# Patient Record
Sex: Female | Born: 1985 | Race: White | Hispanic: No | Marital: Married | Smoking: Never smoker
Health system: Southern US, Community
[De-identification: ages and names within clinical notes are randomized; demographics above are authoritative.]

## PROBLEM LIST (undated history)

## (undated) DIAGNOSIS — Z8619 Personal history of other infectious and parasitic diseases: Secondary | ICD-10-CM

## (undated) DIAGNOSIS — Z8742 Personal history of other diseases of the female genital tract: Secondary | ICD-10-CM

## (undated) DIAGNOSIS — Z9889 Other specified postprocedural states: Secondary | ICD-10-CM

## (undated) DIAGNOSIS — R7611 Nonspecific reaction to tuberculin skin test without active tuberculosis: Secondary | ICD-10-CM

## (undated) DIAGNOSIS — Z87442 Personal history of urinary calculi: Secondary | ICD-10-CM

## (undated) DIAGNOSIS — R112 Nausea with vomiting, unspecified: Secondary | ICD-10-CM

## (undated) HISTORY — DX: Personal history of other infectious and parasitic diseases: Z86.19

## (undated) HISTORY — DX: Nonspecific reaction to tuberculin skin test without active tuberculosis: R76.11

## (undated) HISTORY — DX: Personal history of urinary calculi: Z87.442

## (undated) HISTORY — DX: Personal history of other diseases of the female genital tract: Z87.42

---
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
## 2005-01-16 ENCOUNTER — Emergency Department (HOSPITAL_COMMUNITY): Admission: EM | Admit: 2005-01-16 | Discharge: 2005-01-16 | Payer: Self-pay | Admitting: Emergency Medicine

## 2005-03-09 HISTORY — PX: OTHER SURGICAL HISTORY: SHX169

## 2007-05-21 IMAGING — CT CT PELVIS W/O CM
2 of 4 series · 17 of 46 positions shown, 19 images · IV contrast (agent unspecified)
Comparison: none

CLINICAL DATA: ER patient with left flank pain.
ABDOMEN CT WITHOUT CONTRAST:
TECHNIQUE: Multidetector CT imaging of the abdomen was performed following the standard protocol without IV contrast.
TECHNIQUE: Multidetector CT imaging of the pelvis was performed following the standard protocol without IV contrast.

[Series 3: stone_wo 2.0 b40f st · axial · 0.60mm/px · z∈[+728,+1123]mm · 14 of 613 slices shown, 16 images]
[im 24/613  soft-tissue]
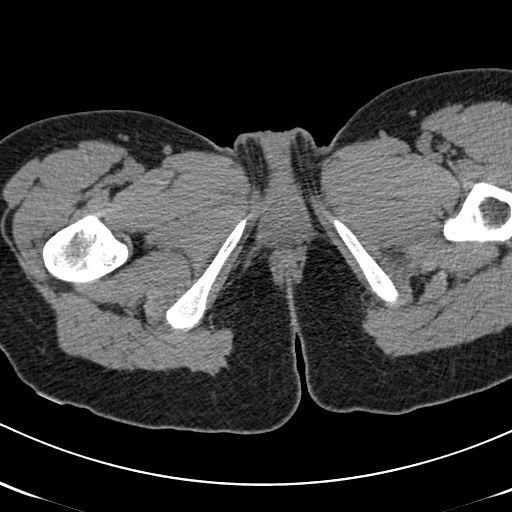
[im 24/613  bone]
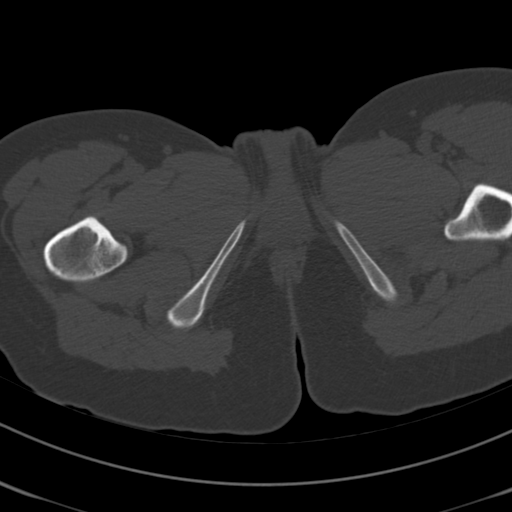
[im 71/613  soft-tissue]
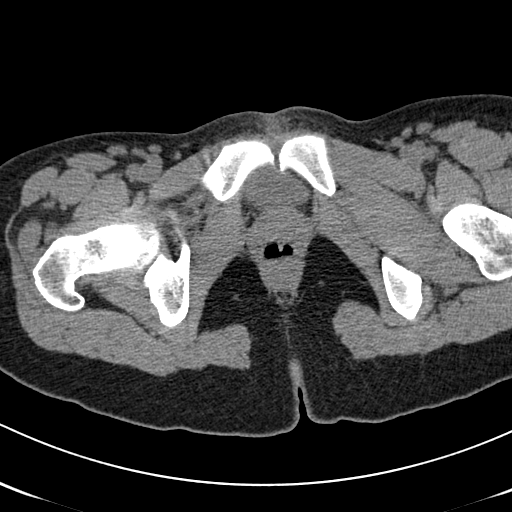
[im 118/613  soft-tissue]
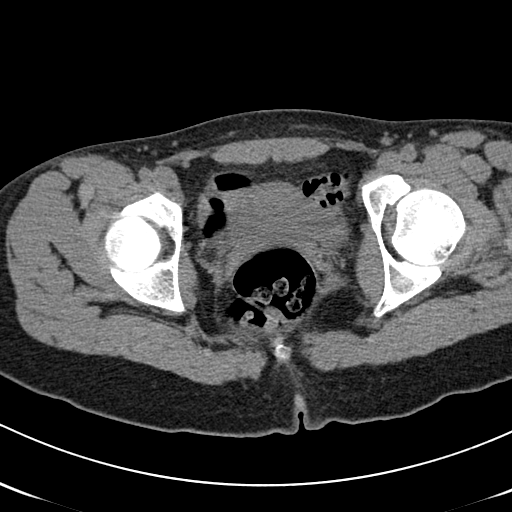
[im 165/613  soft-tissue]
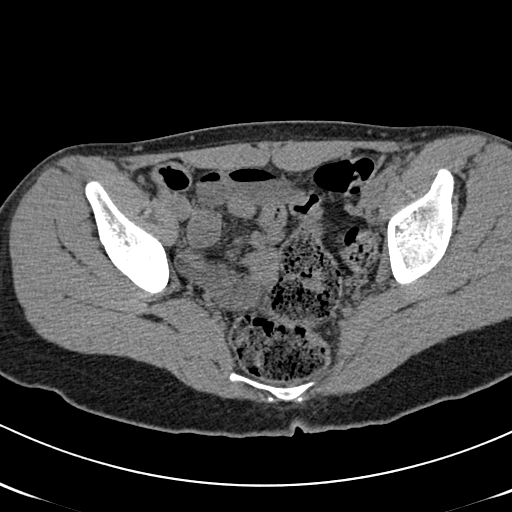
[im 212/613  soft-tissue]
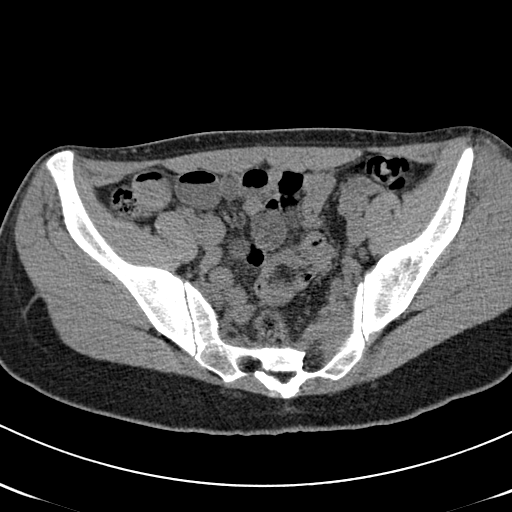
[im 236/613  soft-tissue]
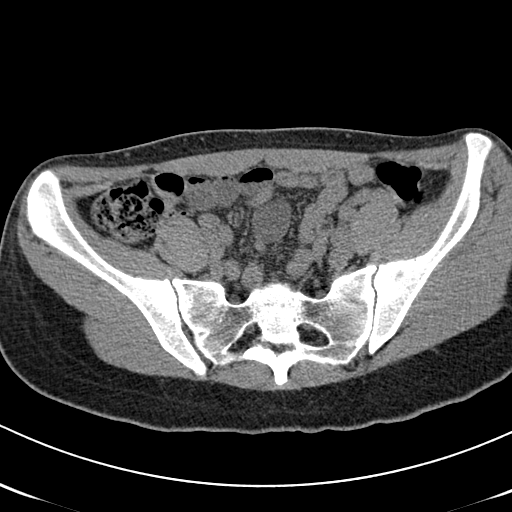
[im 283/613  soft-tissue]
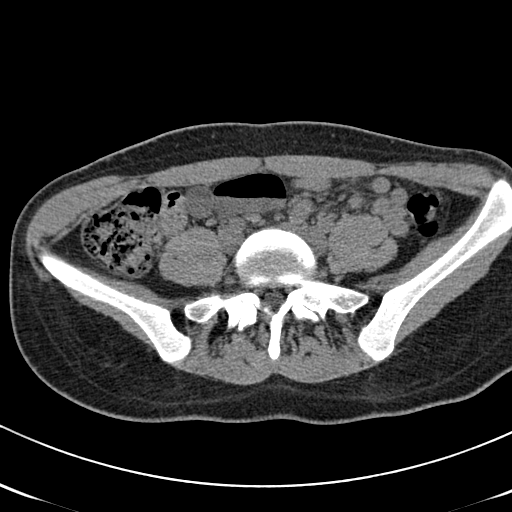
[im 330/613  soft-tissue]
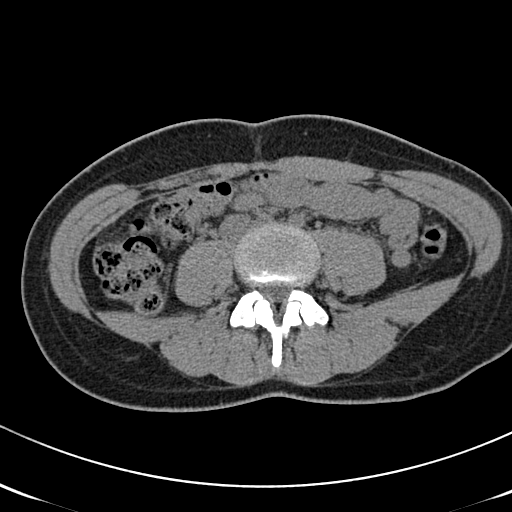
[im 377/613  soft-tissue]
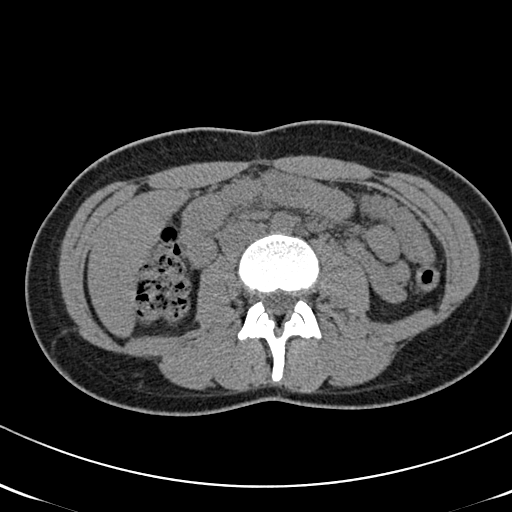
[im 377/613  bone]
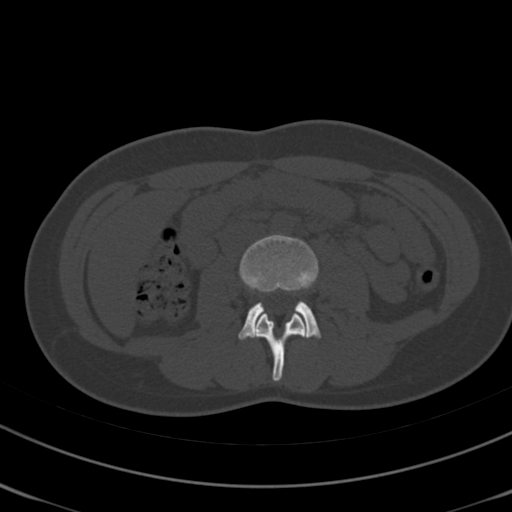
[im 401/613  soft-tissue]
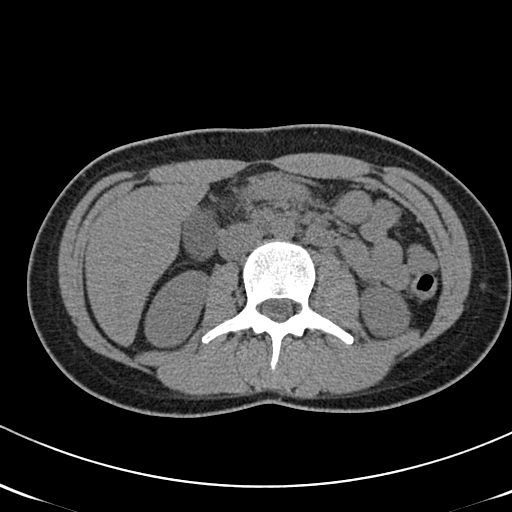
[im 448/613  soft-tissue]
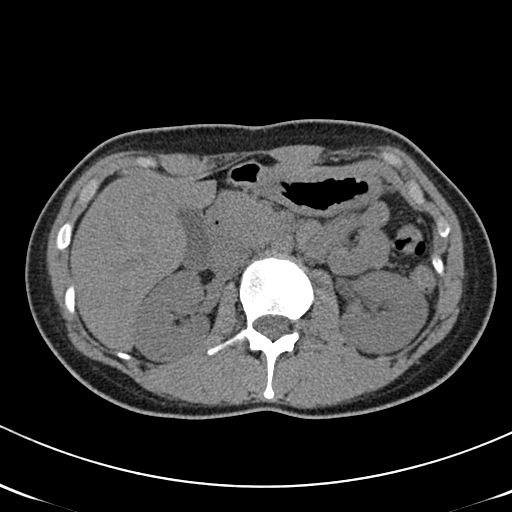
[im 495/613  soft-tissue]
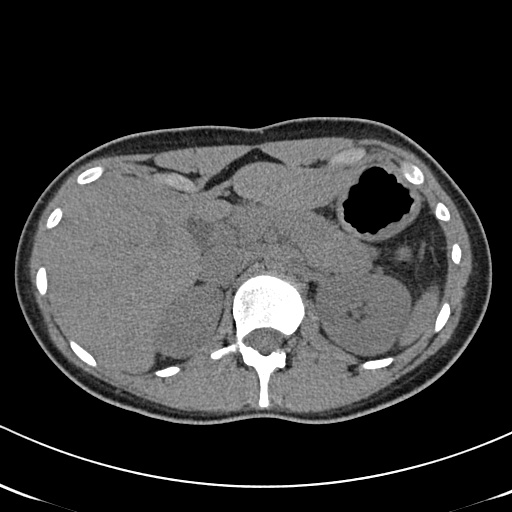
[im 542/613  soft-tissue]
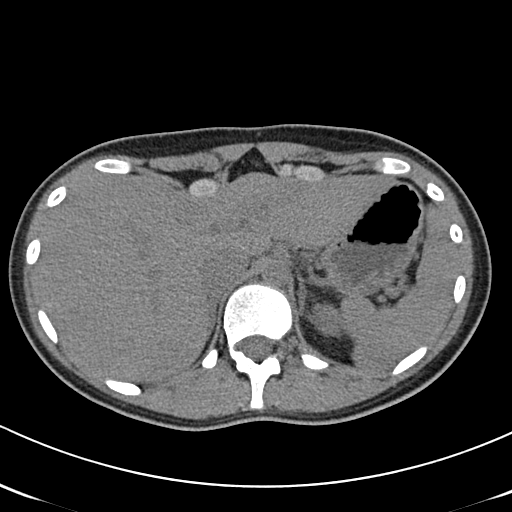
[im 589/613  soft-tissue]
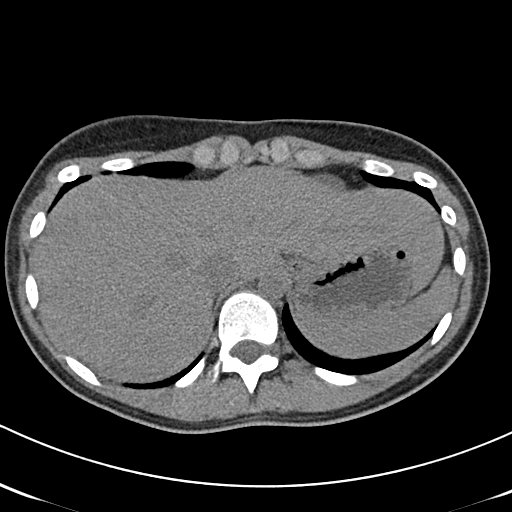

[Series 602: coronal · coronal · 0.84mm/px · 3 of 39 slices shown]
[im 13/39  soft-tissue]
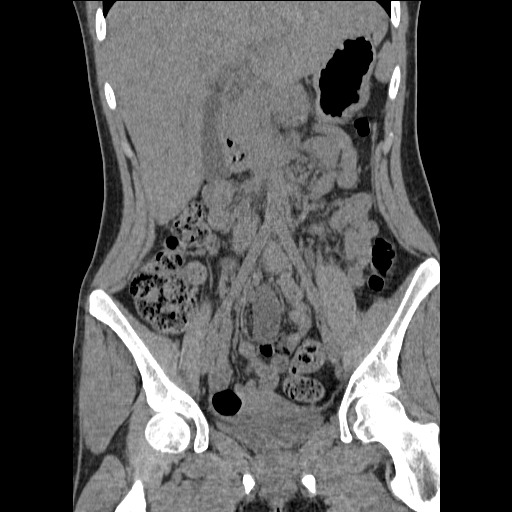
[im 17/39  soft-tissue]
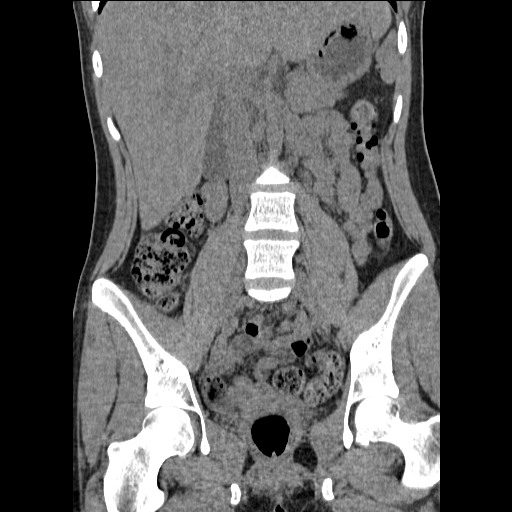
[im 22/39  soft-tissue]
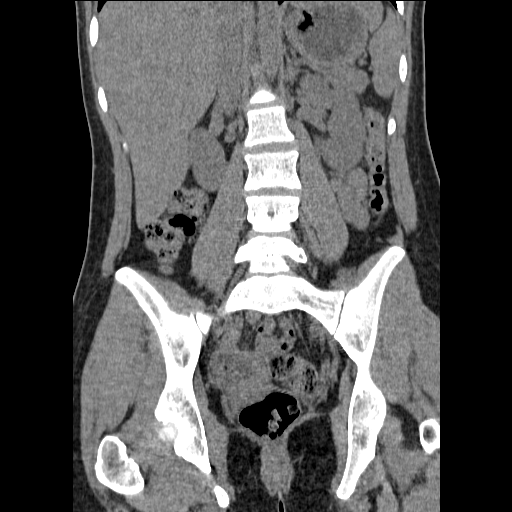

[17 of 46 positions shown; findings below may reference images not displayed]

FINDINGS: there appears to be a subtle enlargement of the left kidney and minimal dilatation of the left collecting system and proximal ureter.  Large amount of stool in the colon.
IMPRESSION: Suspicion for subtle findings of acute left hydronephrosis. CT pelvis to follow.
PELVIS CT WITHOUT CONTRAST:
FINDINGS: On image 87, there are findings compatible with a small (2-3 mm) calculus in the distal left ureter at the UVJ.  (See image 87).  Large amount of stool is present in the rectosigmoid.
IMPRESSION: Although subtle, findings are compatible with a very small stone at the left UVJ.

## 2007-10-27 ENCOUNTER — Ambulatory Visit (HOSPITAL_COMMUNITY): Admission: RE | Admit: 2007-10-27 | Discharge: 2007-10-27 | Payer: Self-pay | Admitting: Obstetrics and Gynecology

## 2007-11-04 ENCOUNTER — Ambulatory Visit: Payer: Self-pay | Admitting: Critical Care Medicine

## 2007-11-08 ENCOUNTER — Encounter: Payer: Self-pay | Admitting: Critical Care Medicine

## 2007-11-09 ENCOUNTER — Encounter: Payer: Self-pay | Admitting: Critical Care Medicine

## 2010-01-30 ENCOUNTER — Emergency Department: Payer: Self-pay | Admitting: Emergency Medicine

## 2010-02-28 IMAGING — CR DG CHEST 2V
2 series · 2 of 2 positions shown · non-contrast
Comparison: None

CLINICAL DATA: Positive PPD.  Nonsmoker

CHEST - 2 VIEW

[view not recorded (1 of 2)]
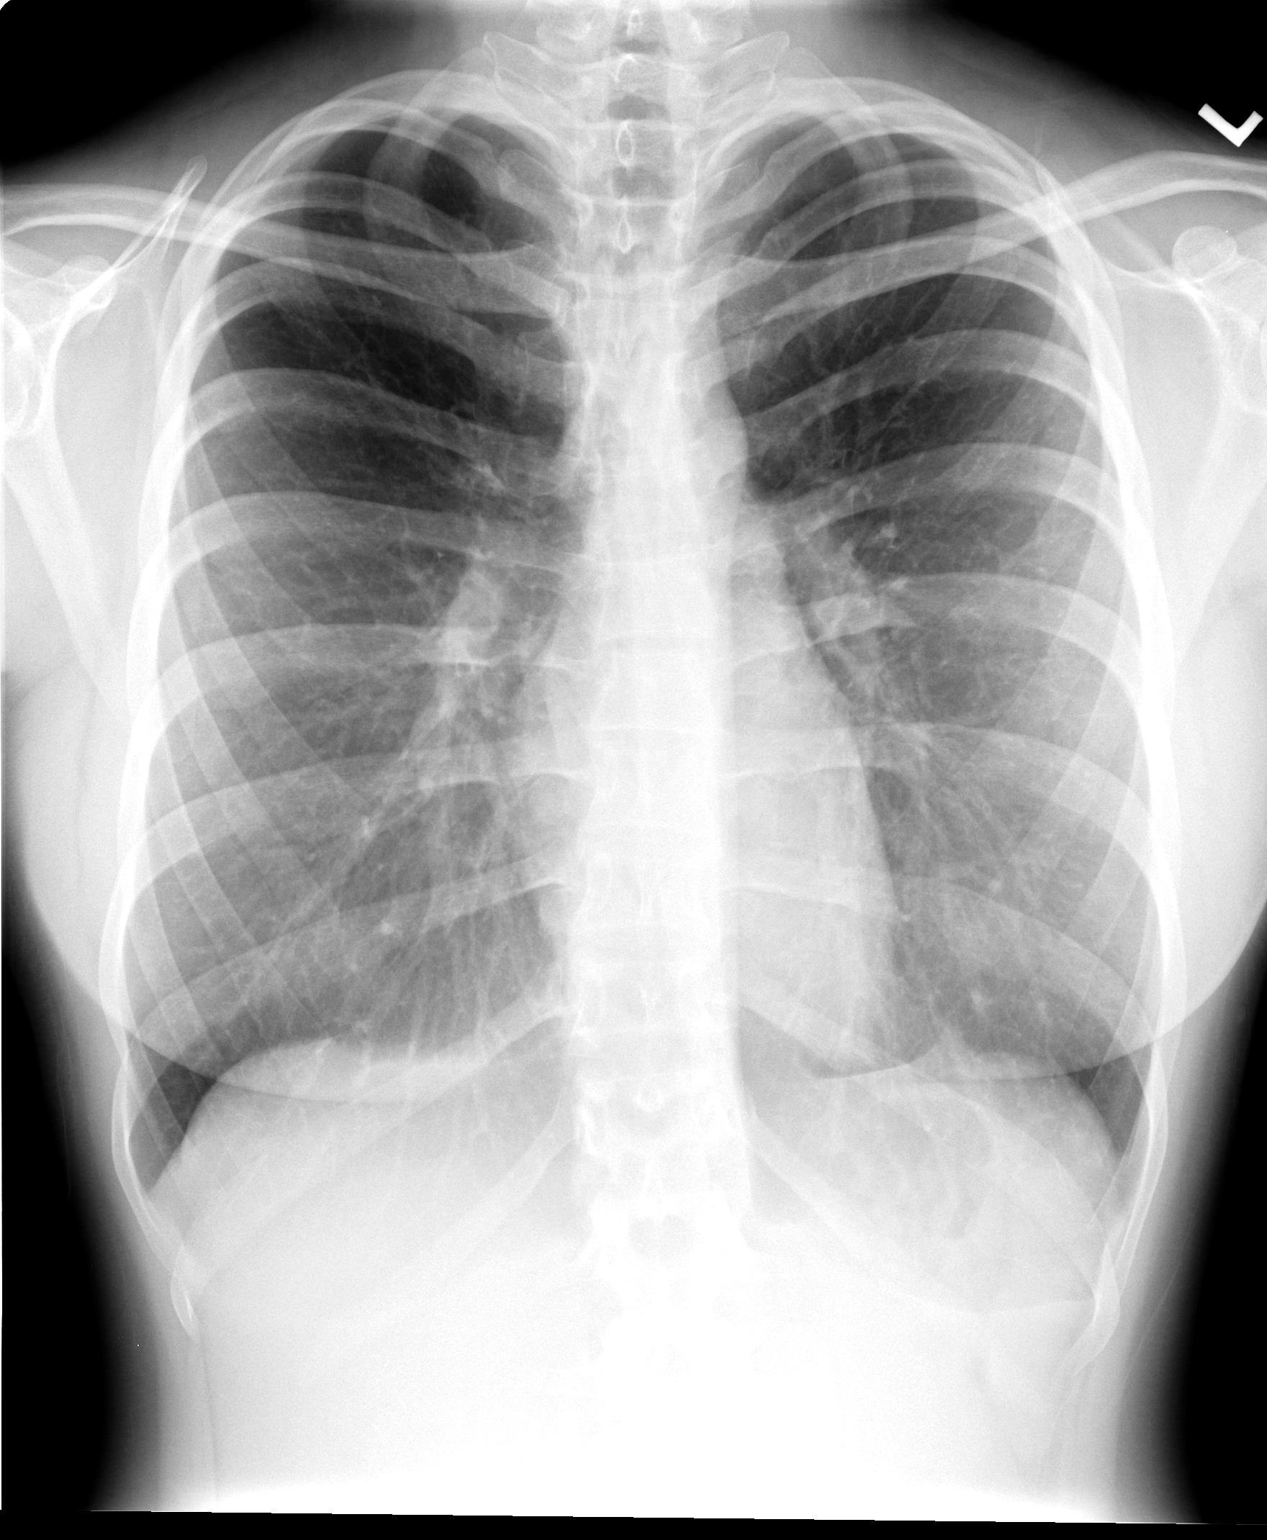

[view not recorded (2 of 2)]
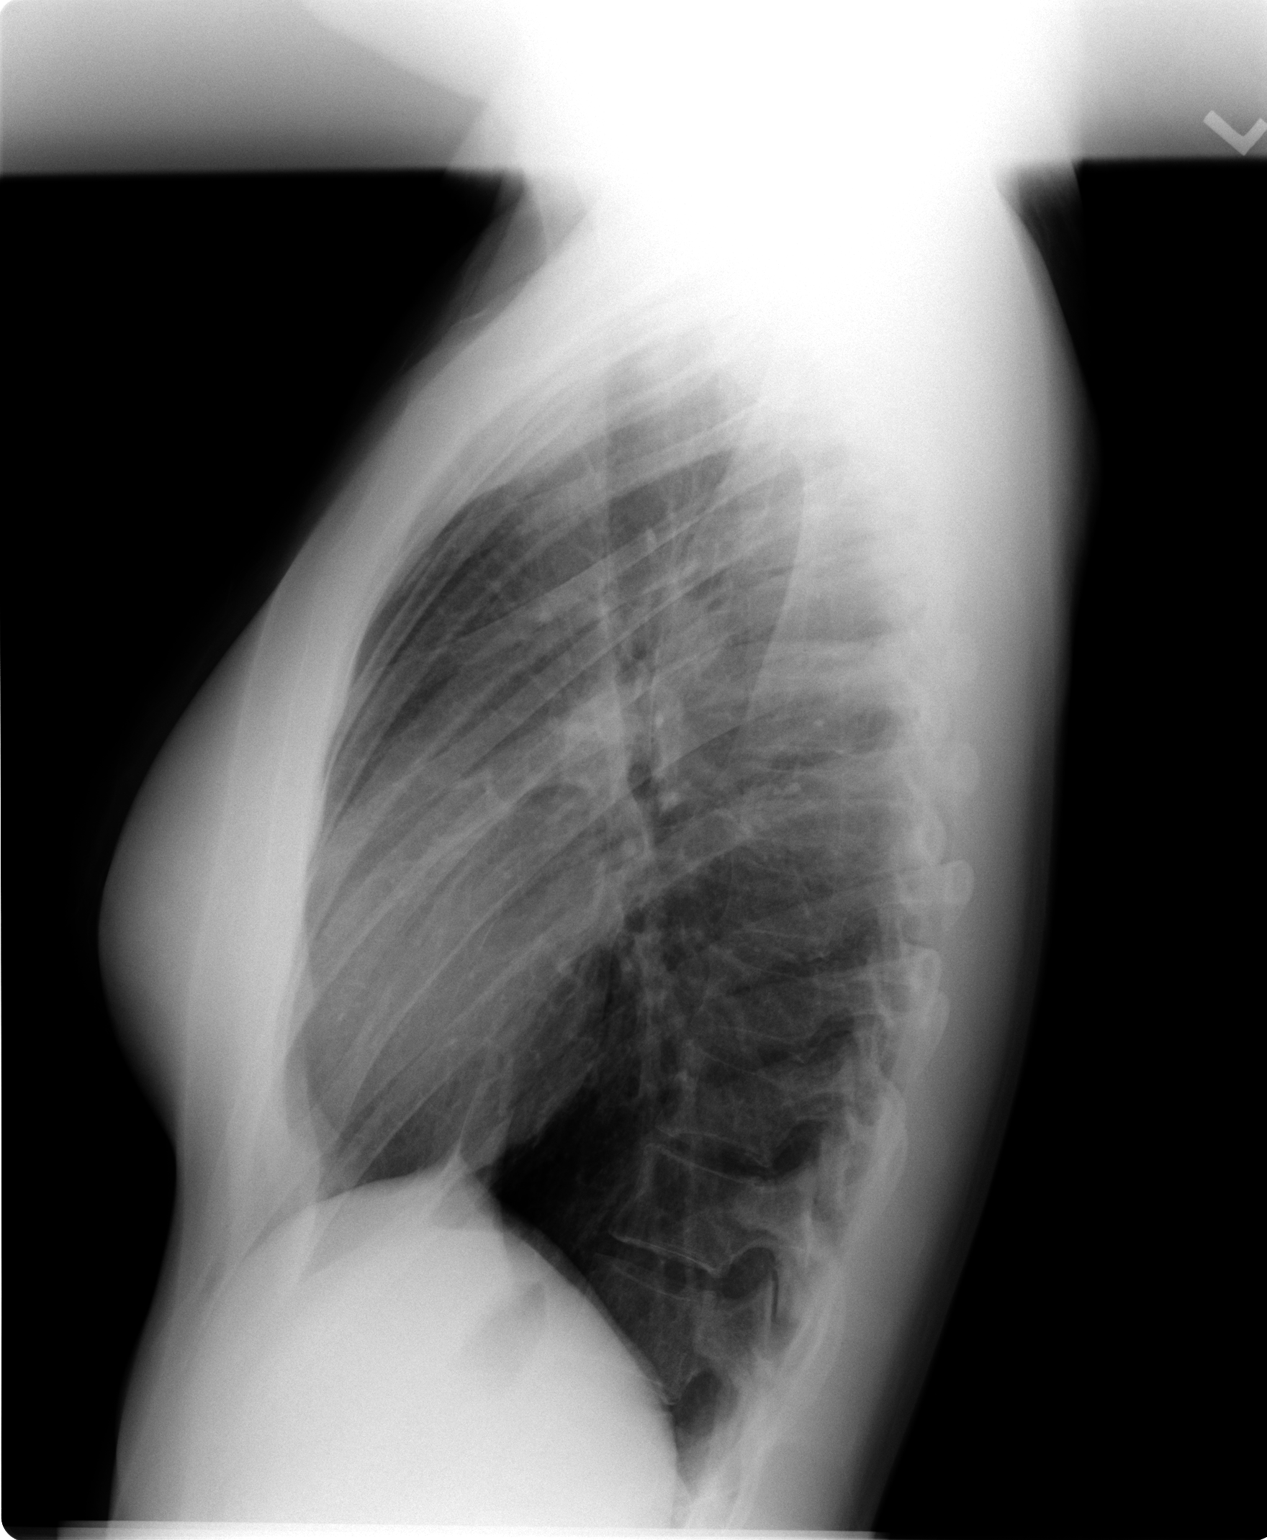

[2 of 2 positions shown; findings below may reference images not displayed]

FINDINGS: Heart and mediastinal contours are within normal limits.
The lung fields are clear with no evidence for focal infiltrate or
congestive failure.  Bony structures appear intact.  No
radiographic evidence for tuberculosis is apparent.
IMPRESSION: No acute disease.

## 2010-07-25 NOTE — Op Note (Signed)
   [name], Mary Leonard                       ACCOUNT NO.:  [id number]   MEDICAL RECORD NO.:  [id number]                   PATIENT TYPE:  AMB   LOCATION:  [facility]                                  FACILITY:  [facility]   PHYSICIAN:  Christopher E. Ezzard Standing, M.D.         DATE OF BIRTH:  1985/04/25   DATE OF PROCEDURE:  07/18/2002  DATE OF DISCHARGE:                                 OPERATIVE REPORT   PREOPERATIVE DIAGNOSIS:  Nasal fracture.   POSTOPERATIVE DIAGNOSIS:  Nasal fracture.   OPERATION:  Closed reduction of nasal fracture.   SURGEON:  Kristine Garbe. Ezzard Standing, M.D.   ANESTHESIA:  General endotracheal.   COMPLICATIONS:  None.   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  She had a  CT scan which shows a complex multifractured nasal bone.  She also sustained  a laceration over the dorsum of the nose and had Steri-Strips applied to  this laceration in the emergency room.  This occurred eight days ago.  She  is taken to the operating room at this time for closed reduction of nasal  fracture.   DESCRIPTION OF PROCEDURE:  After adequate endotracheal anesthesia, Steri-  Strips were removed from the dorsum of the nose, and the nose was cleaned  with alcohol.  The patient had a laceration over the nasal dorsum with some  granulation tissue where the edges were not well approximated.  Intranasally, the septum was relatively midline.  There was no evidence of  septal hematoma.  Using the butter knife and manual manipulation, the nasal  bones were realigned.  There were multiple fractures on the right nasal bone  with what appeared to be more of a single fracture on the left nasal bone.  After realigning the fractured bones, Steri-Strips were applied to the nasal  dorsum covering the previous laceration, and a Thermoplast cast was applied.  The patient was awoken from anesthesia and transferred  to the recovery room  postoperatively doing well.   DISPOSITION:  The patient is discharged home later this morning on Tylenol  and Motrin for pain, Phenergan for nausea.  Will have her follow up in my  office in one week for recheck and have the cast removed.                                               Kristine Garbe. Ezzard Standing, M.D.    CEN/MEDQ  D:  07/18/2002  T:  07/18/2002  Job:  [id number]

## 2011-06-28 ENCOUNTER — Ambulatory Visit (INDEPENDENT_AMBULATORY_CARE_PROVIDER_SITE_OTHER): Payer: BC Managed Care – PPO | Admitting: Family Medicine

## 2011-06-28 VITALS — BP 118/74 | HR 66 | Temp 98.2°F | Resp 16 | Ht 69.0 in | Wt 163.6 lb

## 2011-06-28 DIAGNOSIS — N200 Calculus of kidney: Secondary | ICD-10-CM

## 2011-06-28 DIAGNOSIS — R109 Unspecified abdominal pain: Secondary | ICD-10-CM

## 2011-06-28 DIAGNOSIS — R319 Hematuria, unspecified: Secondary | ICD-10-CM

## 2011-06-28 LAB — POCT UA - MICROSCOPIC ONLY
Casts, Ur, LPF, POC: NEGATIVE
Crystals, Ur, HPF, POC: NEGATIVE
Mucus, UA: POSITIVE
Yeast, UA: NEGATIVE

## 2011-06-28 LAB — COMPREHENSIVE METABOLIC PANEL
ALT: 11 U/L (ref 0–35)
AST: 15 U/L (ref 0–37)
Albumin: 3.9 g/dL (ref 3.5–5.2)
Alkaline Phosphatase: 52 U/L (ref 39–117)
BUN: 11 mg/dL (ref 6–23)
CO2: 24 mEq/L (ref 19–32)
Calcium: 8.8 mg/dL (ref 8.4–10.5)
Chloride: 103 mEq/L (ref 96–112)
Creat: 0.76 mg/dL (ref 0.50–1.10)
Glucose, Bld: 71 mg/dL (ref 70–99)
Potassium: 4.2 mEq/L (ref 3.5–5.3)
Sodium: 135 mEq/L (ref 135–145)
Total Bilirubin: 0.5 mg/dL (ref 0.3–1.2)
Total Protein: 6.8 g/dL (ref 6.0–8.3)

## 2011-06-28 LAB — POCT CBC
Granulocyte percent: 57.9 %G (ref 37–80)
HCT, POC: 41.1 % (ref 37.7–47.9)
Hemoglobin: 13.7 g/dL (ref 12.2–16.2)
Lymph, poc: 2.6 (ref 0.6–3.4)
MCH, POC: 29 pg (ref 27–31.2)
MCHC: 33.3 g/dL (ref 31.8–35.4)
MCV: 86.8 fL (ref 80–97)
MID (cbc): 0.5 (ref 0–0.9)
MPV: 10.4 fL (ref 0–99.8)
POC Granulocyte: 4.2 (ref 2–6.9)
POC LYMPH PERCENT: 35.9 %L (ref 10–50)
POC MID %: 6.2 %M (ref 0–12)
Platelet Count, POC: 291 10*3/uL (ref 142–424)
RBC: 4.73 M/uL (ref 4.04–5.48)
RDW, POC: 13.7 %
WBC: 7.3 10*3/uL (ref 4.6–10.2)

## 2011-06-28 LAB — POCT URINALYSIS DIPSTICK
Bilirubin, UA: NEGATIVE
Glucose, UA: NEGATIVE
Ketones, UA: NEGATIVE
Leukocytes, UA: NEGATIVE
Nitrite, UA: NEGATIVE
Protein, UA: NEGATIVE
Spec Grav, UA: 1.025
Urobilinogen, UA: 0.2
pH, UA: 5.5

## 2011-06-28 MED ORDER — MELOXICAM 7.5 MG PO TABS: 7.5000 mg | ORAL_TABLET | Freq: Every day | ORAL | Status: AC

## 2011-06-28 MED ORDER — HYDROCODONE-ACETAMINOPHEN 5-500 MG PO TABS: 1.0000 | ORAL_TABLET | Freq: Three times a day (TID) | ORAL | Status: AC | PRN

## 2011-06-28 NOTE — Patient Instructions (Signed)
Back Pain, Adult Low back pain is very common. About 1 in 5 people have back pain.The cause of low back pain is rarely dangerous. The pain often gets better over time.About half of people with a sudden onset of back pain feel better in just 2 weeks. About 8 in 10 people feel better by 6 weeks.  CAUSES Some common causes of back pain include:  Strain of the muscles or ligaments supporting the spine.   Wear and tear (degeneration) of the spinal discs.   Arthritis.   Direct injury to the back.  DIAGNOSIS Most of the time, the direct cause of low back pain is not known.However, back pain can be treated effectively even when the exact cause of the pain is unknown.Answering your caregiver's questions about your overall health and symptoms is one of the most accurate ways to make sure the cause of your pain is not dangerous. If your caregiver needs more information, he or she may order lab work or imaging tests (X-rays or MRIs).However, even if imaging tests show changes in your back, this usually does not require surgery. HOME CARE INSTRUCTIONS For many people, back pain returns.Since low back pain is rarely dangerous, it is often a condition that people can learn to manageon their own.   Remain active. It is stressful on the back to sit or stand in one place. Do not sit, drive, or stand in one place for more than 30 minutes at a time. Take short walks on level surfaces as soon as pain allows.Try to increase the length of time you walk each day.   Do not stay in bed.Resting more than 1 or 2 days can delay your recovery.   Do not avoid exercise or work.Your body is made to move.It is not dangerous to be active, even though your back may hurt.Your back will likely heal faster if you return to being active before your pain is gone.   Pay attention to your body when you bend and lift. Many people have less discomfortwhen lifting if they bend their knees, keep the load close to their  bodies,and avoid twisting. Often, the most comfortable positions are those that put less stress on your recovering back.   Find a comfortable position to sleep. Use a firm mattress and lie on your side with your knees slightly bent. If you lie on your back, put a pillow under your knees.   Only take over-the-counter or prescription medicines as directed by your caregiver. Over-the-counter medicines to reduce pain and inflammation are often the most helpful.Your caregiver may prescribe muscle relaxant drugs.These medicines help dull your pain so you can more quickly return to your normal activities and healthy exercise.   Put ice on the injured area.   Put ice in a plastic bag.   Place a towel between your skin and the bag.   Leave the ice on for 15 to 20 minutes, 3 to 4 times a day for the first 2 to 3 days. After that, ice and heat may be alternated to reduce pain and spasms.   Ask your caregiver about trying back exercises and gentle massage. This may be of some benefit.   Avoid feeling anxious or stressed.Stress increases muscle tension and can worsen back pain.It is important to recognize when you are anxious or stressed and learn ways to manage it.Exercise is a great option.  SEEK MEDICAL CARE IF:  You have pain that is not relieved with rest or medicine.   You have   pain that does not improve in 1 week.   You have new symptoms.   You are generally not feeling well.  SEEK IMMEDIATE MEDICAL CARE IF:   You have pain that radiates from your back into your legs.   You develop new bowel or bladder control problems.   You have unusual weakness or numbness in your arms or legs.   You develop nausea or vomiting.   You develop abdominal pain.   You feel faint.  Document Released: 02/23/2005 Document Revised: 02/12/2011 Document Reviewed: 07/14/2010 [facility] Patient Information 2012 [place], [place].Kidney Stones Kidney stones (ureteral lithiasis) are deposits that form inside  your kidneys. The intense pain is caused by the stone moving through the urinary tract. When the stone moves, the ureter goes into spasm around the stone. The stone is usually passed in the urine.  CAUSES   A disorder that makes certain neck glands produce too much parathyroid hormone (primary hyperparathyroidism).   A buildup of uric acid crystals.   Narrowing (stricture) of the ureter.   A kidney obstruction present at birth (congenital obstruction).   Previous surgery on the kidney or ureters.   Numerous kidney infections.  SYMPTOMS   Feeling sick to your stomach (nauseous).   Throwing up (vomiting).   Blood in the urine (hematuria).   Pain that usually spreads (radiates) to the groin.   Frequency or urgency of urination.  DIAGNOSIS   Taking a history and physical exam.   Blood or urine tests.   Computerized X-ray scan (CT scan).   Occasionally, an examination of the inside of the urinary bladder (cystoscopy) is performed.  TREATMENT   Observation.   Increasing your fluid intake.   Surgery may be needed if you have severe pain or persistent obstruction.  The size, location, and chemical composition are all important variables that will determine the proper choice of action for you. Talk to your caregiver to better understand your situation so that you will minimize the risk of injury to yourself and your kidney.  HOME CARE INSTRUCTIONS   Drink enough water and fluids to keep your urine clear or pale yellow.   Strain all urine through the provided strainer. Keep all particulate matter and stones for your caregiver to see. The stone causing the pain may be as small as a grain of salt. It is very important to use the strainer each and every time you pass your urine. The collection of your stone will allow your caregiver to analyze it and verify that a stone has actually passed.   Only take over-the-counter or prescription medicines for pain, discomfort, or fever as  directed by your caregiver.   Make a follow-up appointment with your caregiver as directed.   Get follow-up X-rays if required. The absence of pain does not always mean that the stone has passed. It may have only stopped moving. If the urine remains completely obstructed, it can cause loss of kidney function or even complete destruction of the kidney. It is your responsibility to make sure X-rays and follow-ups are completed. Ultrasounds of the kidney can show blockages and the status of the kidney. Ultrasounds are not associated with any radiation and can be performed easily in a matter of minutes.  SEEK IMMEDIATE MEDICAL CARE IF:   Pain cannot be controlled with the prescribed medicine.   You have a fever.   The severity or intensity of pain increases over 18 hours and is not relieved by pain medicine.   You develop a  new onset of abdominal pain.   You feel faint or pass out.  MAKE SURE YOU:   Understand these instructions.   Will watch your condition.   Will get help right away if you are not doing well or get worse.  Document Released: 02/23/2005 Document Revised: 02/12/2011 Document Reviewed: 06/21/2009 [facility] Patient Information 2012 [place], [place].

## 2011-06-28 NOTE — Progress Notes (Signed)
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 She's having left flank pain which is gone on for one week with varying degrees of intensity. She's also noticed a decreased urinary flow. She's had no hematuria or fever however and denies dysuria as well.  She has history of kidney stones, 3 in the past all of which have been intense and rapidly resolved over a period of hours. For this episode, she felt she might have a kidney stone and took ibuprofen as well as drank a beer and extra fluids without resolution.  Objective: Young adult woman in no distress  CVA: Nontender  Patient moving stiffly with clear discomfort when moving.  Straight-leg raising negative Results for orders placed in visit on 06/28/11  POCT UA - MICROSCOPIC ONLY      Component Value Range   WBC, Ur, HPF, POC 1-2     RBC, urine, microscopic 2-3     Bacteria, U Microscopic trace     Mucus, UA pos     Epithelial cells, urine per micros 0-1     Crystals, Ur, HPF, POC neg     Casts, Ur, LPF, POC neg     Yeast, UA neg    POCT URINALYSIS DIPSTICK      Component Value Range   Color, UA yellow     Clarity, UA clear     Glucose, UA neg     Bilirubin, UA neg     Ketones, UA neg     Spec Grav, UA 1.025     Blood, UA trace     pH, UA 5.5     Protein, UA neg     Urobilinogen, UA 0.2     Nitrite, UA neg     Leukocytes, UA Negative    POCT CBC      Component Value Range   WBC 7.3  4.6 - 10.2 (K/uL)   Lymph, poc 2.6  0.6 - 3.4    POC LYMPH PERCENT 35.9  10 - 50 (%L)   MID (cbc) 0.5  0 - 0.9    POC MID % 6.2  0 - 12 (%M)   POC Granulocyte 4.2  2 - 6.9    Granulocyte percent 57.9  37 - 80 (%G)   RBC 4.73  4.04 - 5.48 (M/uL)   Hemoglobin 13.7  12.2 - 16.2 (g/dL)   HCT, POC 40.9  81.1 - 47.9 (%)   MCV 86.8  80 - 97 (fL)   MCH, POC 29.0  27 - 31.2 (pg)   MCHC 33.3  31.8 - 35.4 (g/dL)   RDW, POC 91.4     Platelet Count, POC 291  142 - 424 (K/uL)   MPV 10.4  0 - 99.8 (fL)  A:  Presumed kidney stone  although this may be muscular problem.  No sign of infection  P: Meloxicam 7.5 qd CMET hydrocodone

## 2011-06-29 ENCOUNTER — Encounter: Payer: Self-pay | Admitting: *Deleted

## 2011-06-29 ENCOUNTER — Telehealth: Payer: Self-pay

## 2011-06-29 NOTE — Telephone Encounter (Signed)
PT STATES SHE HAD CALLED THIS MORNING REGARDING HER RESULTS FROM HER KIDNEY LIVER FUNCTION TEST, BUT NOW SHE HAVE AN APPT AT 3:00 TODAY AT [facility]. PLEASE FAX TO [phone] AND YOU MAY REACH PT AT [phone] IF NEEDED

## 2011-06-29 NOTE — Telephone Encounter (Signed)
Labs faxed to [facility] via Epic.

## 2013-10-12 LAB — OB RESULTS CONSOLE GC/CHLAMYDIA
Chlamydia: NEGATIVE
Gonorrhea: NEGATIVE

## 2013-10-12 LAB — OB RESULTS CONSOLE HIV ANTIBODY (ROUTINE TESTING): HIV: NONREACTIVE

## 2013-10-12 LAB — OB RESULTS CONSOLE ABO/RH: RH Type: POSITIVE

## 2013-10-12 LAB — OB RESULTS CONSOLE RUBELLA ANTIBODY, IGM: RUBELLA: UNDETERMINED

## 2013-10-12 LAB — OB RESULTS CONSOLE ANTIBODY SCREEN: Antibody Screen: NEGATIVE

## 2013-10-12 LAB — OB RESULTS CONSOLE HEPATITIS B SURFACE ANTIGEN: Hepatitis B Surface Ag: NEGATIVE

## 2013-10-12 LAB — OB RESULTS CONSOLE RPR: RPR: NONREACTIVE

## 2013-10-12 LAB — OB RESULTS CONSOLE GBS: GBS: POSITIVE

## 2014-03-09 NOTE — L&D Delivery Note (Signed)
Delivery Note At 8:50 AM a viable female was delivered via Vaginal, Spontaneous Delivery (Presentation: Right Occiput Anterior).  APGAR: 7, 8; weight pending .   Placenta status: Intact, Spontaneous.  Cord: 3 vessels with the following complications: None.  Cord pH: not obtained Nuchal cord x 1  Anesthesia: Epidural  Episiotomy:  none Lacerations:  first Suture Repair: 3.0 chromic Est. Blood Loss (mL):  300  Mom to postpartum.  Baby to Couplet care / Skin to Skin.  Michaelina Blandino L 05/18/2014, 9:04 AM

## 2014-05-16 ENCOUNTER — Encounter (HOSPITAL_COMMUNITY): Payer: Self-pay | Admitting: *Deleted

## 2014-05-16 ENCOUNTER — Telehealth (HOSPITAL_COMMUNITY): Payer: Self-pay | Admitting: *Deleted

## 2014-05-16 NOTE — Telephone Encounter (Signed)
Preadmission screen  

## 2014-05-18 ENCOUNTER — Inpatient Hospital Stay (HOSPITAL_COMMUNITY): Payer: BLUE CROSS/BLUE SHIELD | Admitting: Anesthesiology

## 2014-05-18 ENCOUNTER — Inpatient Hospital Stay (HOSPITAL_COMMUNITY)
Admission: AD | Admit: 2014-05-18 | Discharge: 2014-05-20 | DRG: 775 | Disposition: A | Discharge status: Home / Self Care | Payer: BLUE CROSS/BLUE SHIELD | Source: Ambulatory Visit | Attending: Obstetrics and Gynecology | Admitting: Obstetrics and Gynecology

## 2014-05-18 ENCOUNTER — Encounter (HOSPITAL_COMMUNITY): Payer: Self-pay | Admitting: *Deleted

## 2014-05-18 ENCOUNTER — Inpatient Hospital Stay (HOSPITAL_COMMUNITY): Admission: RE | Admit: 2014-05-18 | Payer: BLUE CROSS/BLUE SHIELD | Source: Ambulatory Visit

## 2014-05-18 DIAGNOSIS — O99824 Streptococcus B carrier state complicating childbirth: Secondary | ICD-10-CM | POA: Diagnosis present

## 2014-05-18 DIAGNOSIS — Z23 Encounter for immunization: Secondary | ICD-10-CM

## 2014-05-18 DIAGNOSIS — N200 Calculus of kidney: Secondary | ICD-10-CM

## 2014-05-18 DIAGNOSIS — N858 Other specified noninflammatory disorders of uterus: Secondary | ICD-10-CM | POA: Diagnosis present

## 2014-05-18 DIAGNOSIS — Z3A39 39 weeks gestation of pregnancy: Secondary | ICD-10-CM | POA: Diagnosis present

## 2014-05-18 LAB — TYPE AND SCREEN
ABO/RH(D): O POS
Antibody Screen: NEGATIVE

## 2014-05-18 LAB — CBC
HCT: 41 % (ref 36.0–46.0)
Hemoglobin: 14 g/dL (ref 12.0–15.0)
MCH: 31.5 pg (ref 26.0–34.0)
MCHC: 34.1 g/dL (ref 30.0–36.0)
MCV: 92.1 fL (ref 78.0–100.0)
Platelets: 202 10*3/uL (ref 150–400)
RBC: 4.45 MIL/uL (ref 3.87–5.11)
RDW: 13.4 % (ref 11.5–15.5)
WBC: 11.4 10*3/uL — ABNORMAL HIGH (ref 4.0–10.5)

## 2014-05-18 LAB — ABO/RH: ABO/RH(D): O POS

## 2014-05-18 LAB — RPR: RPR Ser Ql: NONREACTIVE

## 2014-05-18 MED ORDER — SENNOSIDES-DOCUSATE SODIUM 8.6-50 MG PO TABS
2.0000 | ORAL_TABLET | ORAL | Status: DC
  Administered 2014-05-18 – 2014-05-19 (×2): 2 via ORAL
  Filled 2014-05-18 (×2): qty 2

## 2014-05-18 MED ORDER — BENZOCAINE-MENTHOL 20-0.5 % EX AERO
1.0000 "application " | INHALATION_SPRAY | CUTANEOUS | Status: DC | PRN
  Administered 2014-05-18: 1 via TOPICAL
  Filled 2014-05-18 (×2): qty 56

## 2014-05-18 MED ORDER — FLEET ENEMA 7-19 GM/118ML RE ENEM: 1.0000 | ENEMA | Freq: Every day | RECTAL | Status: DC | PRN

## 2014-05-18 MED ORDER — OXYCODONE-ACETAMINOPHEN 5-325 MG PO TABS: 1.0000 | ORAL_TABLET | ORAL | Status: DC | PRN

## 2014-05-18 MED ORDER — LACTATED RINGERS IV SOLN
INTRAVENOUS | Status: DC
  Administered 2014-05-18: 02:00:00 via INTRAVENOUS

## 2014-05-18 MED ORDER — ZOLPIDEM TARTRATE 5 MG PO TABS
5.0000 mg | ORAL_TABLET | Freq: Every evening | ORAL | Status: DC | PRN
Start: 2014-05-18 — End: 2014-05-20

## 2014-05-18 MED ORDER — ONDANSETRON HCL 4 MG/2ML IJ SOLN: 4.0000 mg | Freq: Four times a day (QID) | INTRAMUSCULAR | Status: DC | PRN

## 2014-05-18 MED ORDER — TETANUS-DIPHTH-ACELL PERTUSSIS 5-2.5-18.5 LF-MCG/0.5 IM SUSP: 0.5000 mL | Freq: Once | INTRAMUSCULAR | Status: DC

## 2014-05-18 MED ORDER — OXYCODONE-ACETAMINOPHEN 5-325 MG PO TABS: 2.0000 | ORAL_TABLET | ORAL | Status: DC | PRN

## 2014-05-18 MED ORDER — DIPHENHYDRAMINE HCL 25 MG PO CAPS: 25.0000 mg | ORAL_CAPSULE | Freq: Four times a day (QID) | ORAL | Status: DC | PRN

## 2014-05-18 MED ORDER — OXYTOCIN BOLUS FROM INFUSION: 500.0000 mL | INTRAVENOUS | Status: DC

## 2014-05-18 MED ORDER — LIDOCAINE HCL (PF) 1 % IJ SOLN
INTRAMUSCULAR | Status: DC | PRN
  Administered 2014-05-18: 4 mL
  Administered 2014-05-18: 5 mL

## 2014-05-18 MED ORDER — FENTANYL 2.5 MCG/ML BUPIVACAINE 1/10 % EPIDURAL INFUSION (WH - ANES)
INTRAMUSCULAR | Status: DC | PRN
  Administered 2014-05-18: 12 mL/h via EPIDURAL

## 2014-05-18 MED ORDER — PHENYLEPHRINE 40 MCG/ML (10ML) SYRINGE FOR IV PUSH (FOR BLOOD PRESSURE SUPPORT): 80.0000 ug | PREFILLED_SYRINGE | INTRAVENOUS | Status: DC | PRN

## 2014-05-18 MED ORDER — LANOLIN HYDROUS EX OINT: TOPICAL_OINTMENT | CUTANEOUS | Status: DC | PRN

## 2014-05-18 MED ORDER — PENICILLIN G POTASSIUM 5000000 UNITS IJ SOLR
5.0000 10*6.[IU] | Freq: Once | INTRAMUSCULAR | Status: AC
  Administered 2014-05-18: 5 10*6.[IU] via INTRAVENOUS
  Filled 2014-05-18: qty 5

## 2014-05-18 MED ORDER — LIDOCAINE-EPINEPHRINE 2 %-1:100000 IJ SOLN
INTRAMUSCULAR | Status: DC | PRN
  Administered 2014-05-18: 5 mL via INTRADERMAL

## 2014-05-18 MED ORDER — BUTORPHANOL TARTRATE 1 MG/ML IJ SOLN
1.0000 mg | Freq: Once | INTRAMUSCULAR | Status: AC
  Administered 2014-05-18: 1 mg via INTRAVENOUS
  Filled 2014-05-18: qty 1

## 2014-05-18 MED ORDER — LIDOCAINE HCL (PF) 1 % IJ SOLN
30.0000 mL | INTRAMUSCULAR | Status: DC | PRN
  Filled 2014-05-18: qty 30

## 2014-05-18 MED ORDER — LACTATED RINGERS IV SOLN: 500.0000 mL | Freq: Once | INTRAVENOUS | Status: DC

## 2014-05-18 MED ORDER — PRENATAL MULTIVITAMIN CH
1.0000 | ORAL_TABLET | Freq: Every day | ORAL | Status: DC
  Administered 2014-05-18 – 2014-05-19 (×2): 1 via ORAL
  Filled 2014-05-18 (×2): qty 1

## 2014-05-18 MED ORDER — ACETAMINOPHEN 325 MG PO TABS: 650.0000 mg | ORAL_TABLET | ORAL | Status: DC | PRN

## 2014-05-18 MED ORDER — FLEET ENEMA 7-19 GM/118ML RE ENEM: 1.0000 | ENEMA | RECTAL | Status: DC | PRN

## 2014-05-18 MED ORDER — OXYTOCIN 40 UNITS IN LACTATED RINGERS INFUSION - SIMPLE MED
62.5000 mL/h | INTRAVENOUS | Status: DC
  Administered 2014-05-18: 999 mL/h via INTRAVENOUS
  Filled 2014-05-18: qty 1000

## 2014-05-18 MED ORDER — IBUPROFEN 600 MG PO TABS
600.0000 mg | ORAL_TABLET | Freq: Four times a day (QID) | ORAL | Status: DC
  Administered 2014-05-18 – 2014-05-19 (×7): 600 mg via ORAL
  Filled 2014-05-18 (×8): qty 1

## 2014-05-18 MED ORDER — EPHEDRINE 5 MG/ML INJ: 10.0000 mg | INTRAVENOUS | Status: DC | PRN

## 2014-05-18 MED ORDER — LACTATED RINGERS IV SOLN: 500.0000 mL | INTRAVENOUS | Status: DC | PRN

## 2014-05-18 MED ORDER — BISACODYL 10 MG RE SUPP
10.0000 mg | Freq: Every day | RECTAL | Status: DC | PRN
  Filled 2014-05-18: qty 1

## 2014-05-18 MED ORDER — CITRIC ACID-SODIUM CITRATE 334-500 MG/5ML PO SOLN: 30.0000 mL | ORAL | Status: DC | PRN

## 2014-05-18 MED ORDER — FENTANYL 2.5 MCG/ML BUPIVACAINE 1/10 % EPIDURAL INFUSION (WH - ANES)
INTRAMUSCULAR | Status: AC
  Filled 2014-05-18: qty 125

## 2014-05-18 MED ORDER — PROMETHAZINE HCL 25 MG/ML IJ SOLN
12.5000 mg | Freq: Once | INTRAMUSCULAR | Status: AC
  Administered 2014-05-18: 12.5 mg via INTRAVENOUS
  Filled 2014-05-18: qty 1

## 2014-05-18 MED ORDER — MEASLES, MUMPS & RUBELLA VAC ~~LOC~~ INJ
0.5000 mL | INJECTION | Freq: Once | SUBCUTANEOUS | Status: AC
  Administered 2014-05-19: 0.5 mL via SUBCUTANEOUS
  Filled 2014-05-18 (×2): qty 0.5

## 2014-05-18 MED ORDER — MEDROXYPROGESTERONE ACETATE 150 MG/ML IM SUSP: 150.0000 mg | INTRAMUSCULAR | Status: DC | PRN

## 2014-05-18 MED ORDER — FENTANYL 2.5 MCG/ML BUPIVACAINE 1/10 % EPIDURAL INFUSION (WH - ANES): 14.0000 mL/h | INTRAMUSCULAR | Status: DC | PRN

## 2014-05-18 MED ORDER — DIBUCAINE 1 % RE OINT
1.0000 "application " | TOPICAL_OINTMENT | RECTAL | Status: DC | PRN
  Administered 2014-05-19: 1 via RECTAL
  Filled 2014-05-18 (×2): qty 28

## 2014-05-18 MED ORDER — PENICILLIN G POTASSIUM 5000000 UNITS IJ SOLR
2.5000 10*6.[IU] | INTRAVENOUS | Status: DC
  Administered 2014-05-18: 2.5 10*6.[IU] via INTRAVENOUS
  Filled 2014-05-18 (×3): qty 2.5

## 2014-05-18 MED ORDER — PHENYLEPHRINE 40 MCG/ML (10ML) SYRINGE FOR IV PUSH (FOR BLOOD PRESSURE SUPPORT)
PREFILLED_SYRINGE | INTRAVENOUS | Status: AC
  Filled 2014-05-18: qty 20

## 2014-05-18 MED ORDER — SIMETHICONE 80 MG PO CHEW: 80.0000 mg | CHEWABLE_TABLET | ORAL | Status: DC | PRN

## 2014-05-18 MED ORDER — WITCH HAZEL-GLYCERIN EX PADS
1.0000 "application " | MEDICATED_PAD | CUTANEOUS | Status: DC | PRN
  Administered 2014-05-19: 1 via TOPICAL

## 2014-05-18 MED ORDER — ONDANSETRON HCL 4 MG/2ML IJ SOLN: 4.0000 mg | INTRAMUSCULAR | Status: DC | PRN

## 2014-05-18 MED ORDER — DIPHENHYDRAMINE HCL 50 MG/ML IJ SOLN: 12.5000 mg | INTRAMUSCULAR | Status: DC | PRN

## 2014-05-18 MED ORDER — ONDANSETRON HCL 4 MG PO TABS: 4.0000 mg | ORAL_TABLET | ORAL | Status: DC | PRN

## 2014-05-18 NOTE — MAU Note (Signed)
Pt reports contractions x 2 hours, small amount of bleeding.

## 2014-05-18 NOTE — MAU Note (Addendum)
Patient presents with c/o contractions every 2-3 minutes for past 2 hours; reports lost mucus plug and having small amount of bloody show. Denies LOF at this time. +FM. States was 1cm and 90% at last SVE in office. Was scheduled for induction at 6am on 05/19/14.

## 2014-05-18 NOTE — Anesthesia Postprocedure Evaluation (Signed)
  Anesthesia Post-op Note  Patient: Mary Leonard  Procedure(s) Performed: * No procedures listed *  Patient Location: Mother/Baby  Anesthesia Type:Epidural  Level of Consciousness: awake, alert , oriented and patient cooperative  Airway and Oxygen Therapy: Patient Spontanous Breathing  Post-op Pain: mild  Post-op Assessment: Patient's Cardiovascular Status Stable, Respiratory Function Stable, No headache, No backache, No residual numbness and No residual motor weakness  Post-op Vital Signs: stable  Last Vitals:  Filed Vitals:   05/18/14 1140  BP: 135/86  Pulse: 86  Temp: 36.5 C  Resp: 18    Complications: No apparent anesthesia complications

## 2014-05-18 NOTE — Progress Notes (Signed)
@  0147 4 minute prolonged deceleration noted; FHR to 80. Recovery to baseline FHR 115 with position change left tilt. Dr. Marcelle OverlieHolland called and informed. Admit orders received.

## 2014-05-18 NOTE — MAU Note (Signed)
Patient vomiting at present time; Reporting increase in pain. Requesting pain medication.

## 2014-05-18 NOTE — Anesthesia Preprocedure Evaluation (Addendum)
Anesthesia Evaluation  Patient identified by MRN, date of birth, ID band Patient awake    Reviewed: Allergy & Precautions, NPO status , Patient's Chart, lab work & pertinent test results  History of Anesthesia Complications Negative for: history of anesthetic complications  Airway Mallampati: II  TM Distance: >3 FB Neck ROM: Full    Dental no notable dental hx.    Pulmonary neg pulmonary ROS,  H/o + PPD, negative chest xray breath sounds clear to auscultation  Pulmonary exam normal       Cardiovascular negative cardio ROS  Rhythm:Regular Rate:Normal     Neuro/Psych negative neurological ROS  negative psych ROS   GI/Hepatic negative GI ROS, Neg liver ROS, GERD-  ,  Endo/Other  negative endocrine ROS  Renal/GU negative Renal ROSH/o kidney stones   negative genitourinary   Musculoskeletal negative musculoskeletal ROS (+)   Abdominal Normal abdominal exam  (+)   Peds negative pediatric ROS (+)  Hematology negative hematology ROS (+)   Anesthesia Other Findings   Reproductive/Obstetrics negative OB ROS                             Anesthesia Physical Anesthesia Plan  ASA: II  Anesthesia Plan: Epidural   Post-op Pain Management:    Induction:   Airway Management Planned:   Additional Equipment:   Intra-op Plan:   Post-operative Plan:   Informed Consent: I have reviewed the patients History and Physical, chart, labs and discussed the procedure including the risks, benefits and alternatives for the proposed anesthesia with the patient or authorized representative who has indicated his/her understanding and acceptance.     Plan Discussed with:   Anesthesia Plan Comments: [age] yr old G1 at term in labor, desires epidural analgesia.  R/B/A discussed.  Plt 202)       Anesthesia Quick Evaluation

## 2014-05-18 NOTE — Lactation Note (Signed)
This note was copied from the chart of Mary Leonard. Lactation Consultation Note  Patient Name: Mary Janalyn RouseWhitney Mehring [id number]'XToday's Date: 05/18/2014 Reason for consult: Initial assessment Visited with Mom, baby at 698 hrs old.  Mom has short shafted nipples that tend to invert some with breast sandwiching.  Initiated use of manual breast pump prior to latching to help pull nipple out and soften areola.  Baby acting hungry following her bath, so attempted with latching.  Baby unable to sustain a deep areolar grasp, she continued to slip onto nipple.  Initiated a 20 mm nipple shield, with instructions on care, and use.  Baby latched in cross cradle hold, and fed for 20 mins with multiple swallows noted.  Mom complaining of uterine cramping during feeding.  Basics reviewed while Mom feeding baby.  Encouraged continued skin to skin and feeding baby when she cues.  Brochure left in room, and instructed on IP and OP lactation services available to her.  To call for assistance prn.  Follow up in am.  Consult Status Consult Status: Follow-up Date: 05/19/14 Follow-up type: In-patient    Judee ClaraSmith, Reita Shindler E 05/18/2014, 5:12 PM

## 2014-05-18 NOTE — H&P (Signed)
Mary Leonard is a 29 y.o. G 1 P 0 at 8039 w 4 days presents in labor Now status post epidural. Maternal Medical History:  Reason for admission: Contractions.   Contractions: Perceived severity is moderate.    Fetal activity: Perceived fetal activity is normal.      OB History    Gravida Para Term Preterm AB TAB SAB Ectopic Multiple Living   1              Past Medical History  Diagnosis Date  . History of PCOS   . Hx of varicella   . 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 History of kidney stones    Past Surgical History  Procedure Laterality Date  . Labia minora reduction  2007  . Nose surgery  2005   Family History: family history includes Cancer in her paternal grandmother; Diabetes in her paternal grandmother; Rheum arthritis in her paternal aunt. Social History:  reports that she has never smoked. She does not have any smokeless tobacco history on file. She reports that she does not drink alcohol or use illicit drugs.   Prenatal Transfer Tool  Maternal Diabetes: No Genetic Screening: Normal Maternal Ultrasounds/Referrals: Normal Fetal Ultrasounds or other Referrals:  None Maternal Substance Abuse:  No Significant Maternal Medications:  None Significant Maternal Lab Results:  None Other Comments:  None  Review of Systems  All other systems reviewed and are negative.   Dilation: Lip/rim Effacement (%): 90 Station: 0, +1 Exam by:: Mary Leonard Blood pressure 110/73, pulse 81, temperature 97.5 F (36.4 C), temperature source Oral, resp. rate 18, height 5\' 9"  (1.753 m), weight 227 lb (102.967 kg), last menstrual period 08/14/2013, SpO2 100 %. Maternal Exam:  Uterine Assessment: Contraction frequency is regular.   Abdomen: Fetal presentation: vertex  Introitus: Normal vulva.   Fetal Exam Fetal State Assessment: Category I - tracings are normal.     Physical Exam  Nursing note and vitals  reviewed. Constitutional: She appears well-developed.  HENT:  Head: Normocephalic.  Eyes: Pupils are equal, round, and reactive to light.  Neck: Normal range of motion.  Cardiovascular: Normal rate and regular rhythm.   Respiratory: Effort normal.    Prenatal labs: ABO, Rh: --/--/O POS, O POS (03/11 0215) Antibody: NEG (03/11 0215) Rubella: Equivocal (08/06 0000) RPR: Nonreactive (08/06 0000)  HBsAg: Negative (08/06 0000)  HIV: Non-reactive (08/06 0000)  GBS: Positive (08/06 0000)   Assessment/Plan: IUP at term Labor GBBS + Anticipate NSVD   Mary Leonard 05/18/2014, 7:56 AM

## 2014-05-18 NOTE — Anesthesia Procedure Notes (Signed)
Epidural  Start time: 05/18/2014 4:30 AM End time: 05/18/2014 4:40 AM  Staffing Anesthesiologist: Rosario JacksPOTISEK, Floy Angert Performed by: anesthesiologist   Preanesthetic Checklist Completed: patient identified, pre-op evaluation, timeout performed, IV checked, risks and benefits discussed and monitors and equipment checked  Epidural Patient position: sitting Prep: ChloraPrep Patient monitoring: blood pressure Approach: midline Location: L3-L4 Injection technique: LOR saline  Needle:  Needle type: Tuohy  Needle gauge: 17 G Needle length: 9 cm Needle insertion depth: 4 cm Catheter size: 19 Gauge Catheter at skin depth: 9 cm Test dose: negative  Assessment Events: blood not aspirated, injection not painful, no injection resistance, negative IV test and no paresthesia  Additional Notes Informed consent obtained prior to proceeding including risk of failure, 1% risk of PDPH, risk of minor discomfort and bruising.  Discussed rare but serious complications including epidural abscess, permanent nerve injury, epidural hematoma.  Discussed alternatives to epidural analgesia and patient desires to proceed.  Timeout performed pre-procedure verifying patient name, procedure, and platelet count.  Patient tolerated procedure well.  Catheter secured at 9 at the skin.  SA test negative as confirmed by no motor block of hip abduction at 5 min post injection of 50 mg of lidocaine into the epidural catheter.  Bupivacaine 0.1% with fentanyl 2.625mcg/ml infused post procedure at 7112ml/hr with PCEA of 9ml every 10 mins.

## 2014-05-19 LAB — CBC
HCT: 35.7 % — ABNORMAL LOW (ref 36.0–46.0)
Hemoglobin: 11.9 g/dL — ABNORMAL LOW (ref 12.0–15.0)
MCH: 31.4 pg (ref 26.0–34.0)
MCHC: 33.3 g/dL (ref 30.0–36.0)
MCV: 94.2 fL (ref 78.0–100.0)
Platelets: 170 10*3/uL (ref 150–400)
RBC: 3.79 MIL/uL — ABNORMAL LOW (ref 3.87–5.11)
RDW: 13.8 % (ref 11.5–15.5)
WBC: 11.7 10*3/uL — ABNORMAL HIGH (ref 4.0–10.5)

## 2014-05-19 NOTE — Lactation Note (Signed)
This note was copied from the chart of Girl Campbell Dugdale. Lactation Consultation Note  Patient Name: Girl Janalyn RouseWhitney Sircy ZOXWR'UToday's Date: 05/19/2014 Reason for consult: Follow-up assessment;Difficult latch;Other (Comment) (latching with NS) LC notified by RN, Hildred LaserSue Owen that mom has recently finished breastfeeding and is using the nipple shield and baby latching for up to 30 minutes per feeding.  Mom wants to see LC in am and wants to be sure that she has a plan for weaning baby off the NS as soon as possible.  For now, baby is exclusively breastfeeding and output is well above minimum for both voids and stools.  [facility]'s room and asked LC to be sure another LC will see mom tomorrow.  LC informed RN that an outpatient appointment is always offered when baby is nursing with a NS.   Maternal Data Formula Feeding for Exclusion: No  Feeding Feeding Type: Breast Fed Length of feed: 25 min  LATCH Score/Interventions              most recently recorded LATCH score=5 due to baby being too sleepy to latch but LATCH=9 per previous LC assessment using NS and also earlier today by RN        Lactation Tools Discussed/Used   RN asked if LC would visit tomorrow  Consult Status Consult Status: Follow-up Date: 05/20/14 Follow-up type: In-patient    Warrick ParisianBryant, Jvon Meroney [facility] 05/19/2014, 10:55 PM

## 2014-05-19 NOTE — Progress Notes (Signed)
S:  Patient is doing well. O:  BP 116/75 mmHg  Pulse 94  Temp(Src) 98.4 F (36.9 C) (Oral)  Resp 18  Ht 5\' 9"  (1.753 m)  Wt 227 lb (102.967 kg)  BMI 33.51 kg/m2  SpO2 98%  LMP 08/14/2013  Breastfeeding? Unknown Results for orders placed or performed during the hospital encounter of 05/18/14 (from the past 24 hour(s))  CBC     Status: Abnormal   Collection Time: 05/19/14  6:37 AM  Result Value Ref Range   WBC 11.7 (H) 4.0 - 10.5 K/uL   RBC 3.79 (L) 3.87 - 5.11 MIL/uL   Hemoglobin 11.9 (L) 12.0 - 15.0 g/dL   HCT 29.535.7 (L) 62.136.0 - 30.846.0 %   MCV 94.2 78.0 - 100.0 fL   MCH 31.4 26.0 - 34.0 pg   MCHC 33.3 30.0 - 36.0 g/dL   RDW 65.713.8 84.611.5 - 96.215.5 %   Platelets 170 150 - 400 K/uL   General alert and oriented Lung CTAB Car RRR Abdomen is soft   IMPRESSION: PPD #1  PLAN: Doing well Routine care Discharge tomorrow

## 2014-05-20 NOTE — Lactation Note (Signed)
This note was copied from the chart of Mary Leonard. Lactation Consultation Note  Patient Name: Mary Mary RouseWhitney Flamm JXBJY'NToday's Date: 05/20/2014 Reason for consult: Follow-up assessment;Difficult latch  Visited with Mom and FOB on day of discharge, baby 3250 hrs old.  Mom feeding baby in cradle hold without supporting her breast, and baby not latched deeply onto breast.  Offered assistance with positioning baby to help with better milk transfer.  Noted that breasts look fuller, and heavier.  Had Mom pre-pump for a minute to pull nipple out, and Mom's mature milk coming in and manual pump expressed milk easily.  Tried a couple times without the nipple shield, baby opens widely, but unable to maintain depth on the breast.  Reinforced importance of pre-pumping and properly place nipple shield to avoid baby latching onto shield only.  Baby undressed, and placed skin to skin with explanation on why this is beneficial.  Baby latched easily and fed with multiple swallows.  Feeding followed with a yellow, seedy stool.  Basic teaching done.  Encouraged OP lactation appointment to follow up to using the nipple shield.  OP appointment made for 05/24/14 @ 2:30pm.  Mom has access to a pump, but not sure what type.  Offered a 2 week rental with explanation on how program works.  Mom to use the manual pump prior to feeding, and after to keep breast soft.  Storage guidelines [facility] to [facility].  Encouraged lots of skin to skin, and feeding often on cue.  Engorgement prevention and treatment discussed.  Encouraged to call prn.  To follow up with Pediatrician tomorrow, and Lactation office 3/17.   Consult Status Consult Status: Follow-up Date: 05/24/14 Follow-up type: Out-patient    Judee ClaraSmith, Chenise Mulvihill E 05/20/2014, 11:04 AM

## 2014-05-20 NOTE — Progress Notes (Signed)
Removed epidural catheter, tip intact patient tolerated  well.

## 2014-05-20 NOTE — Discharge Summary (Signed)
Obstetric Discharge Summary Reason for Admission: onset of labor Prenatal Procedures: none Intrapartum Procedures: spontaneous vaginal delivery Postpartum Procedures: none Complications-Operative and Postpartum: first degree perineal laceration HEMOGLOBIN  Date Value Ref Range Status  05/19/2014 11.9* 12.0 - 15.0 g/dL Final  01/02/725304/21/2013 66.413.7 12.2 - 16.2 g/dL Final   HCT  Date Value Ref Range Status  05/19/2014 35.7* 36.0 - 46.0 % Final   HCT, POC  Date Value Ref Range Status  06/28/2011 41.1 37.7 - 47.9 % Final    Physical Exam:  General: alert Lochia: appropriate Uterine Fundus: firm Incision: healing well DVT Evaluation: No evidence of DVT seen on physical exam.  Discharge Diagnoses: Term Pregnancy-delivered  Discharge Information: Date: 05/20/2014 Activity: pelvic rest Diet: routine Medications: Ibuprofen Condition: improved Instructions: refer to practice specific booklet Discharge to: home   Newborn Data: Live born female  Birth Weight: 8 lb 1.3 oz (3665 g) APGAR: 7, 8  Home with mother.  Wilbur Oakland L 05/20/2014, 8:57 AM

## 2014-05-24 ENCOUNTER — Ambulatory Visit (HOSPITAL_COMMUNITY)
Admit: 2014-05-24 | Discharge: 2014-05-24 | Disposition: A | Discharge status: Home / Self Care | Payer: BLUE CROSS/BLUE SHIELD | Attending: Obstetrics and Gynecology | Admitting: Obstetrics and Gynecology

## 2014-05-24 NOTE — Lactation Note (Signed)
Lactation Consult  Mother's reason for visit: F/u from the hospital and breast pump inform  Visit Type: feeding assessment with use of NS  Appointment Notes:  Using NS , short shafted nipple , left apt reminder  Consult:  Initial Lactation Consultant:  Kathrin Greathouse  ________________________________________________________________________ Mary Leonard Name: Mary Leonard Date of Birth: 05/18/2014 Pediatrician: Dr. Eliberto Ivory  Gender: female Gestational Age: [redacted]w[redacted]d (At Birth) Birth Weight: 8 lb 1.3 oz (3665 g) Weight at Discharge: Weight: 7 lb 12.2 oz (3520 g)Date of Discharge: 05/20/2014 Filed Weights   05/18/14 0850 05/18/14 2341 05/20/14 0102  Weight: 8 lb 1.3 oz (3665 g) 7 lb 15.2 oz (3605 g) 7 lb 12.2 oz (3520 g)   [facility] 7-14 oz ( Tuesday 3/15 )  Location:Pediatrician's office Weight today:3664 g , 8-1.3 oz   _______________________________________________________________________  Mother's Name: Mary Leonard Type of delivery:  Vaginal  Breastfeeding Experience:  Per mom milk came in prior to leaving hospital  Maternal Medical Conditions:  Polycystic ovarian syndrome ( per mom excellent breast changes with pregnancy , leakage 3rd trimester  Maternal Medications:  PNV  ________________________________________________________________________  Breastfeeding History (Post Discharge)  Frequency of breastfeeding: - every 2-3 hours  Duration of feeding: 10 -30 mins   Supplementing - None   Pumping - per mom X 1 daily with 150 ml    Infant Intake and Output Assessment  Voids:  8 +  in 24 hrs.  Color:  Clear yellow Stools: 10 +  in 24 hrs.  Color:  Yellow  ________________________________________________________________________  Maternal Breast Assessment  Breast:  Engorged ( border line )  Nipple:  Erect to flat due to engorgement  Pain level:  0 Pain interventions:  Expressed  breast milk  _______________________________________________________________________ Feeding Assessment/Evaluation  Initial feeding assessment:  Infant's oral assessment:  WNL  Positioning:  Football Left breast  LATCH documentation:  Latch:  1 = Repeated attempts needed to sustain latch, nipple held in mouth throughout feeding, stimulation needed to elicit sucking reflex.  Audible swallowing:  2 = Spontaneous and intermittent  Type of nipple:  1 = Flat  Comfort (Breast/Nipple):  1 = Filling, red/small blisters or bruises, mild/mod discomfort  Hold (Positioning):  1 = Assistance needed to correctly position infant at breast and maintain latch  LATCH score:  6 , improved ( using the NS )   Attached assessment:  Shallow at 1st   Lips flanged:  Yes.    Lips untucked:  Yes.    Suck assessment:  Nutritive  Tools:  Nipple shield 20 mm ( tried the #24 NS ) , #20 NS fit better  Instructed on use and cleaning of tool:  Yes.    Pre-feed weight:  3664 g , 8-1.3 oz  Re-weight due stool ) - 3652 g , 8-0.8 oz  Post-feed weight:  3730 g , 8-3.6 oz  Amount transferred:  78 ml  Amount supplemented:  None   Additional Feeding Assessment -   Infant's oral assessment:  WNL  Positioning:  Football Right breast  LATCH documentation:  Latch:  2 = Grasps breast easily, tongue down, lips flanged, rhythmical sucking.  Audible swallowing:  2 = Spontaneous and intermittent  Type of nipple:  2 = Everted at rest and after stimulation  Comfort (Breast/Nipple):  1 = Filling, red/small blisters or bruises, mild/mod discomfort  Hold (Positioning):  1 = Assistance needed to correctly position infant at breast and maintain latch  LATCH score:  8   Attached assessment:  Deep  Lips flanged:  Yes.    Lips untucked:  Yes.    Suck assessment:  Nutritive  Tools:  Nipple shield 20 mm Instructed on use and cleaning of tool:  Yes.   Wet diaper changed - re-weight  Pre-feed weight: 3704 g , 8-2.6 oz   Post-feed weight: 3734 g , 8-3.7 oz  Amount transferred: 30 ml  Amount supplemented: none    Total amount pumped post feed:  R 4- 1/2 oz   L 4 1/2  Pre pumped due to engorgement off left with hand pump = 2 oz  Total volume pumped off = 11 oz   Total amount transferred:  108 ml  Total supplement given: none   Lactation Impression:  Mom presents today with Baby and dad. Both breast are boarder line engorged , easily hand express and had mom pre-pump with hand pump in order  To release breast enough so the Nipple shield will fit properly. 2 oz off left breast. ( pre-pump )  Attempted with #24 NS due to the size of the nipple and baby unable to obtain the proper depth  Switched back to #20 NS , instilled EBM in the to with curved tip syringe and baby latched well  Both breast 15 -20 mins , and took a 20 mins break and re-latched for 10 mins . Milk transfer with a NS was impressive for a 6 day old infant of 108 ml  After baby fed - LC had mom post pump with a DEBP ( Ameda she brought form home ) , goof fitting flange  And mom pumped off a huge amount of milk , breast softened bilaterally , per mom comfortable. LC stressed to mom in an hour if the breast start feeling full to release down with hand expressing or hand pump  To prevent engorgement and keep breast under control. Mom has great milk production. Prior to D/C form LC O/P breast under control and engorgement relieved   Lactation Plan of Care :  Praised mom for her efforts breast feeding  Stressed to mom has the baby grows and gets stronger with sucking pattern and pulls the nipple up into the NS and it feels tight to increase to #24  Mom, rest , naps , plenty fluids , especially water , nutritious snacks and meals  Feedings - every 2 1/2 - 3 hours and with feeding cues  If the breast are to full to start - release down so areola more compressible ( hand express, hand pump )  Always soften 1st breast well before before offering  the 2nd breast  If "Ellie " only feeds on the 1st breast - release 2nd breast to comfort with hand expressing or pumping. Goal - is to protect milk supply  Storage breast milk page 25 Baby and me booklet  [employer] - call them today to change apt form tomorrow to next Tuesday or Wed. For weight check

## 2016-05-14 ENCOUNTER — Encounter (HOSPITAL_COMMUNITY): Payer: Self-pay | Admitting: *Deleted

## 2016-05-18 ENCOUNTER — Encounter (HOSPITAL_COMMUNITY): Payer: Self-pay

## 2016-05-18 ENCOUNTER — Ambulatory Visit (HOSPITAL_COMMUNITY): Payer: BLUE CROSS/BLUE SHIELD | Admitting: Anesthesiology

## 2016-05-18 ENCOUNTER — Ambulatory Visit (HOSPITAL_COMMUNITY): Payer: BLUE CROSS/BLUE SHIELD

## 2016-05-18 ENCOUNTER — Encounter (HOSPITAL_COMMUNITY)
Admission: RE | Disposition: A | Discharge status: Home / Self Care | Payer: Self-pay | Source: Ambulatory Visit | Attending: Obstetrics and Gynecology

## 2016-05-18 ENCOUNTER — Ambulatory Visit (HOSPITAL_COMMUNITY)
Admission: RE | Admit: 2016-05-18 | Discharge: 2016-05-18 | Disposition: A | Discharge status: Home / Self Care | Payer: BLUE CROSS/BLUE SHIELD | Source: Ambulatory Visit | Attending: Obstetrics and Gynecology | Admitting: Obstetrics and Gynecology

## 2016-05-18 DIAGNOSIS — Z79899 Other long term (current) drug therapy: Secondary | ICD-10-CM | POA: Diagnosis not present

## 2016-05-18 DIAGNOSIS — O021 Missed abortion: Secondary | ICD-10-CM | POA: Diagnosis present

## 2016-05-18 DIAGNOSIS — E282 Polycystic ovarian syndrome: Secondary | ICD-10-CM | POA: Diagnosis not present

## 2016-05-18 HISTORY — PX: DILATION AND EVACUATION: SHX1459

## 2016-05-18 HISTORY — DX: Other specified postprocedural states: Z98.890

## 2016-05-18 HISTORY — DX: Other specified postprocedural states: R11.2

## 2016-05-18 LAB — CBC
HEMATOCRIT: 40.3 % (ref 36.0–46.0)
Hemoglobin: 13.6 g/dL (ref 12.0–15.0)
MCH: 30.1 pg (ref 26.0–34.0)
MCHC: 33.7 g/dL (ref 30.0–36.0)
MCV: 89.2 fL (ref 78.0–100.0)
Platelets: 242 10*3/uL (ref 150–400)
RBC: 4.52 MIL/uL (ref 3.87–5.11)
RDW: 12.7 % (ref 11.5–15.5)
WBC: 6.4 10*3/uL (ref 4.0–10.5)

## 2016-05-18 SURGERY — DILATION AND EVACUATION, UTERUS
Anesthesia: Monitor Anesthesia Care

## 2016-05-18 MED ORDER — FENTANYL CITRATE (PF) 100 MCG/2ML IJ SOLN
INTRAMUSCULAR | Status: AC
  Filled 2016-05-18: qty 2

## 2016-05-18 MED ORDER — LIDOCAINE HCL 1 % IJ SOLN
INTRAMUSCULAR | Status: DC | PRN
  Administered 2016-05-18: 10 mL

## 2016-05-18 MED ORDER — MIDAZOLAM HCL 2 MG/2ML IJ SOLN
INTRAMUSCULAR | Status: AC
  Filled 2016-05-18: qty 2

## 2016-05-18 MED ORDER — KETOROLAC TROMETHAMINE 30 MG/ML IJ SOLN
INTRAMUSCULAR | Status: DC | PRN
  Administered 2016-05-18: 30 mg via INTRAVENOUS

## 2016-05-18 MED ORDER — MIDAZOLAM HCL 5 MG/5ML IJ SOLN
INTRAMUSCULAR | Status: DC | PRN
  Administered 2016-05-18 (×2): 1 mg via INTRAVENOUS

## 2016-05-18 MED ORDER — LIDOCAINE 2% (20 MG/ML) 5 ML SYRINGE
INTRAMUSCULAR | Status: DC | PRN
  Administered 2016-05-18 (×2): 40 mg via INTRAVENOUS

## 2016-05-18 MED ORDER — FENTANYL CITRATE (PF) 100 MCG/2ML IJ SOLN: 25.0000 ug | INTRAMUSCULAR | Status: DC | PRN

## 2016-05-18 MED ORDER — PROPOFOL 10 MG/ML IV BOLUS
INTRAVENOUS | Status: DC | PRN
  Administered 2016-05-18 (×2): 20 mg via INTRAVENOUS
  Administered 2016-05-18 (×2): 50 mg via INTRAVENOUS
  Administered 2016-05-18: 20 mg via INTRAVENOUS

## 2016-05-18 MED ORDER — DEXTROSE 5 % IV SOLN: 2000.0000 mg | Freq: Once | INTRAVENOUS | Status: DC

## 2016-05-18 MED ORDER — ACETAMINOPHEN 160 MG/5ML PO SOLN
975.0000 mg | Freq: Four times a day (QID) | ORAL | Status: DC | PRN
  Administered 2016-05-18: 975 mg via ORAL

## 2016-05-18 MED ORDER — CEFAZOLIN SODIUM-DEXTROSE 2-4 GM/100ML-% IV SOLN
2.0000 g | Freq: Once | INTRAVENOUS | Status: AC
  Administered 2016-05-18: 2 g via INTRAVENOUS
  Filled 2016-05-18: qty 100

## 2016-05-18 MED ORDER — SCOPOLAMINE 1 MG/3DAYS TD PT72
MEDICATED_PATCH | TRANSDERMAL | Status: AC
  Administered 2016-05-18: 1.5 mg via TRANSDERMAL
  Filled 2016-05-18: qty 1

## 2016-05-18 MED ORDER — KETOROLAC TROMETHAMINE 30 MG/ML IJ SOLN: 30.0000 mg | Freq: Once | INTRAMUSCULAR | Status: DC | PRN

## 2016-05-18 MED ORDER — SCOPOLAMINE 1 MG/3DAYS TD PT72
1.0000 | MEDICATED_PATCH | Freq: Once | TRANSDERMAL | Status: DC
  Administered 2016-05-18: 1.5 mg via TRANSDERMAL

## 2016-05-18 MED ORDER — FENTANYL CITRATE (PF) 100 MCG/2ML IJ SOLN
INTRAMUSCULAR | Status: DC | PRN
  Administered 2016-05-18 (×2): 50 ug via INTRAVENOUS

## 2016-05-18 MED ORDER — PROMETHAZINE HCL 25 MG/ML IJ SOLN: 6.2500 mg | INTRAMUSCULAR | Status: DC | PRN

## 2016-05-18 MED ORDER — LACTATED RINGERS IV SOLN
INTRAVENOUS | Status: DC
  Administered 2016-05-18: 13:00:00 via INTRAVENOUS

## 2016-05-18 MED ORDER — LIDOCAINE HCL 1 % IJ SOLN
INTRAMUSCULAR | Status: AC
  Filled 2016-05-18: qty 20

## 2016-05-18 MED ORDER — ACETAMINOPHEN 160 MG/5ML PO SOLN
ORAL | Status: AC
  Administered 2016-05-18: 975 mg via ORAL
  Filled 2016-05-18: qty 40.6

## 2016-05-18 SURGICAL SUPPLY — 19 items
CATH ROBINSON RED A/P 16FR (CATHETERS) ×3 IMPLANT
CLOTH BEACON ORANGE TIMEOUT ST (SAFETY) ×3 IMPLANT
DECANTER SPIKE VIAL GLASS SM (MISCELLANEOUS) ×3 IMPLANT
GLOVE BIO SURGEON STRL SZ 6.5 (GLOVE) ×4 IMPLANT
GLOVE BIO SURGEONS STRL SZ 6.5 (GLOVE) ×2
GLOVE BIOGEL PI IND STRL 7.0 (GLOVE) ×1 IMPLANT
GLOVE BIOGEL PI INDICATOR 7.0 (GLOVE) ×2
GOWN STRL REUS W/TWL LRG LVL3 (GOWN DISPOSABLE) ×6 IMPLANT
KIT BERKELEY 1ST TRIMESTER 3/8 (MISCELLANEOUS) ×3 IMPLANT
NS IRRIG 1000ML POUR BTL (IV SOLUTION) ×3 IMPLANT
PACK VAGINAL MINOR WOMEN LF (CUSTOM PROCEDURE TRAY) ×3 IMPLANT
PAD OB MATERNITY 4.3X12.25 (PERSONAL CARE ITEMS) ×3 IMPLANT
PAD PREP 24X48 CUFFED NSTRL (MISCELLANEOUS) ×3 IMPLANT
SET BERKELEY SUCTION TUBING (SUCTIONS) ×3 IMPLANT
TOWEL OR 17X24 6PK STRL BLUE (TOWEL DISPOSABLE) ×6 IMPLANT
VACURETTE 10 RIGID CVD (CANNULA) IMPLANT
VACURETTE 7MM CVD STRL WRAP (CANNULA) ×3 IMPLANT
VACURETTE 8 RIGID CVD (CANNULA) IMPLANT
VACURETTE 9 RIGID CVD (CANNULA) IMPLANT

## 2016-05-18 NOTE — Anesthesia Postprocedure Evaluation (Signed)
Anesthesia Post Note  Patient: Mary Leonard  Procedure(s) Performed: Procedure(s) (LRB): DILATATION AND EVACUATION WITH ULTRASOUND GUIDANCE (N/A)  Patient location during evaluation: PACU Anesthesia Type: MAC Level of consciousness: awake and alert Pain management: pain level controlled Vital Signs Assessment: post-procedure vital signs reviewed and stable Respiratory status: spontaneous breathing, nonlabored ventilation, respiratory function stable and patient connected to nasal cannula oxygen Cardiovascular status: stable and blood pressure returned to baseline Anesthetic complications: no        Last Vitals:  Vitals:   05/18/16 1445 05/18/16 1515  BP: 109/83 106/71  Pulse: (!) 54 (!) 54  Resp: 16 16  Temp:  36.6 C    Last Pain:  Vitals:   05/18/16 1309  TempSrc: Oral   Pain Goal: Patients Stated Pain Goal: 3 (05/18/16 1309)               Tiajuana Amass

## 2016-05-18 NOTE — Brief Op Note (Signed)
05/18/2016  2:06 PM  PATIENT:  Mary Leonard  31 y.o. female  PRE-OPERATIVE DIAGNOSIS:  missed ab  POST-OPERATIVE DIAGNOSIS:  missed ab  PROCEDURE:  Procedure(s): DILATATION AND EVACUATION WITH ULTRASOUND GUIDANCE (N/A)  SURGEON:  Surgeon(s) and Role:    * Marcelle OverlieMichelle Halleigh Comes, MD - Primary  PHYSICIAN ASSISTANT:   ASSISTANTS: none   ANESTHESIA:   IV sedation and paracervical block  EBL:  Total I/O In: -  Out: 100 [Urine:100]  BLOOD ADMINISTERED:none  DRAINS: none   LOCAL MEDICATIONS USED:  LIDOCAINE   SPECIMEN:  Source of Specimen:  POC  DISPOSITION OF SPECIMEN:  PATHOLOGY  COUNTS: Correct x 2  TOURNIQUET:  * No tourniquets in log *  DICTATION: .Other Dictation: Dictation Number [id number]  PLAN OF CARE: Discharge to home after PACU  PATIENT DISPOSITION:  PACU - hemodynamically stable.   Delay start of Pharmacological VTE agent (>24hrs) due to surgical blood loss or risk of bleeding: not applicable

## 2016-05-18 NOTE — Discharge Instructions (Signed)

## 2016-05-18 NOTE — Op Note (Signed)
Mary Rouse:  Leonard, Mary Leonard             ACCOUNT NO.:  [id number]  MEDICAL RECORD NO.:  [id number]  LOCATION:                                 FACILITY:  PHYSICIAN:  Thereasa Iannello L. Vincente PoliGrewal, M.D.    DATE OF BIRTH:  DATE OF PROCEDURE:  05/18/2016 DATE OF DISCHARGE:                              OPERATIVE REPORT   PREOPERATIVE DIAGNOSIS:  Missed abortion.  POSTOPERATIVE DIAGNOSIS:  Missed abortion.  PROCEDURE:  D and E with ultrasound guidance.  SURGEON:  Shanay Woolman L. Vincente PoliGrewal, M.D.  ANESTHESIA:  IV sedation, paracervical block.  COMPLICATIONS:  None.  DRAINS:  None.  PATHOLOGY:  Products of conception.  DESCRIPTION OF PROCEDURE:  The patient was taken to the operating room. Informed consent was obtained.  She was then prepped and draped in the usual sterile fashion.  After time-out was performed, using ultrasound guidance, I could see the sac.  I inserted a speculum and I then performed a paracervical block.  The cervical internal os was gently dilated using Pratt dilators.  The #7 suction cannula was inserted and we suctioned the uterus 2 times under direct visualization with ultrasound with retrieval of all products of conception.  At the end of the procedure, a sharp curette was inserted.  The uterus was thoroughly curetted of all tissue.  Bleeding was minimal.  At the end of the procedure, the uterus was felt empty and it looked empty with ultrasound.  All instruments removed from the vagina.  All sponge, lap, and instrument counts were correct x2.  The patient went to recovery room in stable condition.     Letcher Schweikert L. Vincente PoliGrewal, M.D.     Florestine AversMLG/MEDQ  D:  05/18/2016  T:  05/18/2016  Job:  [id number]

## 2016-05-18 NOTE — Transfer of Care (Signed)
Immediate Anesthesia Transfer of Care Note  Patient: Mary Leonard  Procedure(s) Performed: Procedure(s): DILATATION AND EVACUATION WITH ULTRASOUND GUIDANCE (N/A)  Patient Location: PACU  Anesthesia Type:MAC  Level of Consciousness: awake, alert  and oriented  Airway & Oxygen Therapy: Patient Spontanous Breathing  Post-op Assessment: Report given to RN and Post -op Vital signs reviewed and stable  Post vital signs: Reviewed and stable  Last Vitals:  Vitals:   05/18/16 1309  BP: 115/83  Pulse: 75  Resp: 20  Temp: 36.6 C    Last Pain:  Vitals:   05/18/16 1309  TempSrc: Oral      Patients Stated Pain Goal: 3 (81/82/99 3716)  Complications: No apparent anesthesia complications

## 2016-05-18 NOTE — H&P (Signed)
31 year old G 2 P 1 with missed ab at 7 weeks Rh +  Past Medical History:  Diagnosis Date  . History of kidney stones   . History of PCOS   . Hx of varicella   . PONV (postoperative nausea and vomiting)   . 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 HAND TENDON SURGERY Left 1999   middle finger  . labia minora reduction  2007  . NOSE SURGERY  2005  . WISDOM TOOTH EXTRACTION  2002   Allergies None  Prior to Admission medications   Medication Sig Start Date End Date Taking? Authorizing Provider  guaiFENesin (MUCINEX) 600 MG 12 hr tablet Take by mouth 2 (two) times daily.   Yes Historical Provider, MD  ibuprofen (ADVIL,MOTRIN) 200 MG tablet Take 200 mg by mouth daily as needed for headache or moderate pain.   Yes Historical Provider, MD   BP 115/83   Pulse 75   Temp 97.8 F (36.6 C) (Oral)   Resp 20   Ht 5\' 9"  (1.753 m)   Wt 81.6 kg (180 lb)   SpO2 100%   BMI 26.58 kg/m   Car RRR Lung CTAB Abdomen is soft and non tender Pelvic above  IMPRESSION: D and E with Ultrasound guidance Risks reviewed Consent signed

## 2016-05-18 NOTE — Anesthesia Preprocedure Evaluation (Signed)
Anesthesia Evaluation  Patient identified by MRN, date of birth, ID band Patient awake    Reviewed: Allergy & Precautions, NPO status , Patient's Chart, lab work & pertinent test results  History of Anesthesia Complications (+) PONV  Airway Mallampati: II  TM Distance: >3 FB Neck ROM: Full    Dental  (+) Dental Advisory Given   Pulmonary neg pulmonary ROS,    breath sounds clear to auscultation       Cardiovascular negative cardio ROS   Rhythm:Regular Rate:Normal     Neuro/Psych negative neurological ROS     GI/Hepatic negative GI ROS, Neg liver ROS,   Endo/Other  negative endocrine ROS  Renal/GU negative Renal ROS     Musculoskeletal   Abdominal   Peds  Hematology negative hematology ROS (+)   Anesthesia Other Findings   Reproductive/Obstetrics                             Lab Results  Component Value Date   WBC 6.4 05/18/2016   HGB 13.6 05/18/2016   HCT 40.3 05/18/2016   MCV 89.2 05/18/2016   PLT 242 05/18/2016   Lab Results  Component Value Date   CREATININE 0.76 06/28/2011   BUN 11 06/28/2011   NA 135 06/28/2011   K 4.2 06/28/2011   CL 103 06/28/2011   CO2 24 06/28/2011    Anesthesia Physical Anesthesia Plan  ASA: I  Anesthesia Plan: MAC   Post-op Pain Management:    Induction: Intravenous  Airway Management Planned: Natural Airway and Simple Face Mask  Additional Equipment:   Intra-op Plan:   Post-operative Plan:   Informed Consent: I have reviewed the patients History and Physical, chart, labs and discussed the procedure including the risks, benefits and alternatives for the proposed anesthesia with the patient or authorized representative who has indicated his/her understanding and acceptance.     Plan Discussed with: CRNA  Anesthesia Plan Comments:         Anesthesia Quick Evaluation

## 2016-05-19 ENCOUNTER — Encounter (HOSPITAL_COMMUNITY): Payer: Self-pay | Admitting: Obstetrics and Gynecology

## 2017-03-09 NOTE — L&D Delivery Note (Signed)
Delivery Note At 2:57 PM a viable female was delivered via Vaginal, Spontaneous OA Presentation  Placenta status:spontaneously with 3 vessel cord , .  Cord:  with the following complications:none .  Cord pH: not obtained  Anesthesia:  epidrual Episiotomy: None Lacerations: 1st degree Suture Repair: 3.0 chromic Est. Blood Loss (mL): 300  Mom to postpartum.  Baby to Couplet care / Skin to Skin.  Philander Ake L 11/19/2017, 3:11 PM

## 2017-11-19 ENCOUNTER — Inpatient Hospital Stay (HOSPITAL_COMMUNITY): Payer: BLUE CROSS/BLUE SHIELD | Admitting: Anesthesiology

## 2017-11-19 ENCOUNTER — Inpatient Hospital Stay (HOSPITAL_COMMUNITY)
Admission: AD | Admit: 2017-11-19 | Discharge: 2017-11-20 | DRG: 807 | Disposition: A | Discharge status: Home / Self Care | Payer: BLUE CROSS/BLUE SHIELD | Attending: Obstetrics and Gynecology | Admitting: Obstetrics and Gynecology

## 2017-11-19 ENCOUNTER — Other Ambulatory Visit: Payer: Self-pay

## 2017-11-19 ENCOUNTER — Encounter (HOSPITAL_COMMUNITY): Payer: Self-pay | Admitting: *Deleted

## 2017-11-19 DIAGNOSIS — O99824 Streptococcus B carrier state complicating childbirth: Principal | ICD-10-CM | POA: Diagnosis present

## 2017-11-19 DIAGNOSIS — Z3A39 39 weeks gestation of pregnancy: Secondary | ICD-10-CM | POA: Diagnosis not present

## 2017-11-19 DIAGNOSIS — Z3483 Encounter for supervision of other normal pregnancy, third trimester: Secondary | ICD-10-CM | POA: Diagnosis present

## 2017-11-19 LAB — TYPE AND SCREEN
ABO/RH(D): O POS
ANTIBODY SCREEN: NEGATIVE

## 2017-11-19 LAB — CBC
HEMATOCRIT: 39.8 % (ref 36.0–46.0)
Hemoglobin: 13.2 g/dL (ref 12.0–15.0)
MCH: 30.6 pg (ref 26.0–34.0)
MCHC: 33.2 g/dL (ref 30.0–36.0)
MCV: 92.1 fL (ref 78.0–100.0)
PLATELETS: 192 10*3/uL (ref 150–400)
RBC: 4.32 MIL/uL (ref 3.87–5.11)
RDW: 13.1 % (ref 11.5–15.5)
WBC: 10.3 10*3/uL (ref 4.0–10.5)

## 2017-11-19 LAB — POCT FERN TEST
POCT FERN TEST: POSITIVE
POCT Fern Test: POSITIVE

## 2017-11-19 LAB — HEPATITIS B SURFACE ANTIGEN: Hepatitis B Surface Ag: NEGATIVE

## 2017-11-19 MED ORDER — ACETAMINOPHEN 325 MG PO TABS
650.0000 mg | ORAL_TABLET | ORAL | Status: DC | PRN
  Administered 2017-11-20: 650 mg via ORAL
  Filled 2017-11-19: qty 2

## 2017-11-19 MED ORDER — ONDANSETRON HCL 4 MG/2ML IJ SOLN: 4.0000 mg | Freq: Four times a day (QID) | INTRAMUSCULAR | Status: DC | PRN

## 2017-11-19 MED ORDER — FLEET ENEMA 7-19 GM/118ML RE ENEM: 1.0000 | ENEMA | RECTAL | Status: DC | PRN

## 2017-11-19 MED ORDER — ONDANSETRON HCL 4 MG PO TABS: 4.0000 mg | ORAL_TABLET | ORAL | Status: DC | PRN

## 2017-11-19 MED ORDER — DIPHENHYDRAMINE HCL 50 MG/ML IJ SOLN: 12.5000 mg | INTRAMUSCULAR | Status: DC | PRN

## 2017-11-19 MED ORDER — TETANUS-DIPHTH-ACELL PERTUSSIS 5-2.5-18.5 LF-MCG/0.5 IM SUSP: 0.5000 mL | Freq: Once | INTRAMUSCULAR | Status: DC

## 2017-11-19 MED ORDER — DIBUCAINE 1 % RE OINT
1.0000 "application " | TOPICAL_OINTMENT | RECTAL | Status: DC | PRN
  Administered 2017-11-19: 1 via RECTAL
  Filled 2017-11-19 (×2): qty 28

## 2017-11-19 MED ORDER — COCONUT OIL OIL
1.0000 "application " | TOPICAL_OIL | Status: DC | PRN
  Filled 2017-11-19: qty 120

## 2017-11-19 MED ORDER — OXYCODONE-ACETAMINOPHEN 5-325 MG PO TABS: 2.0000 | ORAL_TABLET | ORAL | Status: DC | PRN

## 2017-11-19 MED ORDER — BENZOCAINE-MENTHOL 20-0.5 % EX AERO
1.0000 "application " | INHALATION_SPRAY | CUTANEOUS | Status: DC | PRN
  Administered 2017-11-19: 1 via TOPICAL
  Filled 2017-11-19: qty 56

## 2017-11-19 MED ORDER — TERBUTALINE SULFATE 1 MG/ML IJ SOLN: 0.2500 mg | Freq: Once | INTRAMUSCULAR | Status: DC | PRN

## 2017-11-19 MED ORDER — MEDROXYPROGESTERONE ACETATE 150 MG/ML IM SUSP: 150.0000 mg | INTRAMUSCULAR | Status: DC | PRN

## 2017-11-19 MED ORDER — PHENYLEPHRINE 40 MCG/ML (10ML) SYRINGE FOR IV PUSH (FOR BLOOD PRESSURE SUPPORT)
80.0000 ug | PREFILLED_SYRINGE | INTRAVENOUS | Status: DC | PRN
  Filled 2017-11-19: qty 10

## 2017-11-19 MED ORDER — ONDANSETRON HCL 4 MG/2ML IJ SOLN: 4.0000 mg | INTRAMUSCULAR | Status: DC | PRN

## 2017-11-19 MED ORDER — IBUPROFEN 600 MG PO TABS
600.0000 mg | ORAL_TABLET | Freq: Four times a day (QID) | ORAL | Status: DC
  Administered 2017-11-19 – 2017-11-20 (×5): 600 mg via ORAL
  Filled 2017-11-19 (×5): qty 1

## 2017-11-19 MED ORDER — FENTANYL 2.5 MCG/ML BUPIVACAINE 1/10 % EPIDURAL INFUSION (WH - ANES)
14.0000 mL/h | INTRAMUSCULAR | Status: DC | PRN
  Administered 2017-11-19: 14 mL/h via EPIDURAL
  Filled 2017-11-19: qty 100

## 2017-11-19 MED ORDER — LACTATED RINGERS IV SOLN: 500.0000 mL | Freq: Once | INTRAVENOUS | Status: DC

## 2017-11-19 MED ORDER — OXYTOCIN 40 UNITS IN LACTATED RINGERS INFUSION - SIMPLE MED: 2.5000 [IU]/h | INTRAVENOUS | Status: DC

## 2017-11-19 MED ORDER — OXYTOCIN 40 UNITS IN LACTATED RINGERS INFUSION - SIMPLE MED
1.0000 m[IU]/min | INTRAVENOUS | Status: DC
  Filled 2017-11-19: qty 1000

## 2017-11-19 MED ORDER — PRENATAL MULTIVITAMIN CH
1.0000 | ORAL_TABLET | Freq: Every day | ORAL | Status: DC
  Administered 2017-11-20: 1 via ORAL
  Filled 2017-11-19: qty 1

## 2017-11-19 MED ORDER — BISACODYL 10 MG RE SUPP
10.0000 mg | Freq: Every day | RECTAL | Status: DC | PRN
  Filled 2017-11-19: qty 1

## 2017-11-19 MED ORDER — SODIUM BICARBONATE 8.4 % IV SOLN
INTRAVENOUS | Status: DC | PRN
  Administered 2017-11-19: 4 mL via EPIDURAL
  Administered 2017-11-19: 3 mL via EPIDURAL
  Administered 2017-11-19: 4 mL via EPIDURAL

## 2017-11-19 MED ORDER — DIPHENHYDRAMINE HCL 25 MG PO CAPS: 25.0000 mg | ORAL_CAPSULE | Freq: Four times a day (QID) | ORAL | Status: DC | PRN

## 2017-11-19 MED ORDER — LIDOCAINE HCL (PF) 1 % IJ SOLN: 30.0000 mL | INTRAMUSCULAR | Status: DC | PRN

## 2017-11-19 MED ORDER — MEASLES, MUMPS & RUBELLA VAC ~~LOC~~ INJ
0.5000 mL | INJECTION | Freq: Once | SUBCUTANEOUS | Status: DC
  Filled 2017-11-19: qty 0.5

## 2017-11-19 MED ORDER — EPHEDRINE 5 MG/ML INJ: 10.0000 mg | INTRAVENOUS | Status: DC | PRN

## 2017-11-19 MED ORDER — WITCH HAZEL-GLYCERIN EX PADS
1.0000 "application " | MEDICATED_PAD | CUTANEOUS | Status: DC | PRN
  Administered 2017-11-19: 1 via TOPICAL

## 2017-11-19 MED ORDER — LACTATED RINGERS IV SOLN
INTRAVENOUS | Status: DC
  Administered 2017-11-19: 11:00:00 via INTRAVENOUS

## 2017-11-19 MED ORDER — ZOLPIDEM TARTRATE 5 MG PO TABS: 5.0000 mg | ORAL_TABLET | Freq: Every evening | ORAL | Status: DC | PRN

## 2017-11-19 MED ORDER — PENICILLIN G 3 MILLION UNITS IVPB - SIMPLE MED
3.0000 10*6.[IU] | INTRAVENOUS | Status: DC
  Filled 2017-11-19 (×4): qty 100

## 2017-11-19 MED ORDER — PHENYLEPHRINE 40 MCG/ML (10ML) SYRINGE FOR IV PUSH (FOR BLOOD PRESSURE SUPPORT): 80.0000 ug | PREFILLED_SYRINGE | INTRAVENOUS | Status: DC | PRN

## 2017-11-19 MED ORDER — FLEET ENEMA 7-19 GM/118ML RE ENEM: 1.0000 | ENEMA | Freq: Every day | RECTAL | Status: DC | PRN

## 2017-11-19 MED ORDER — OXYTOCIN BOLUS FROM INFUSION
500.0000 mL | Freq: Once | INTRAVENOUS | Status: AC
  Administered 2017-11-19: 500 mL via INTRAVENOUS

## 2017-11-19 MED ORDER — SODIUM CHLORIDE 0.9 % IV SOLN
5.0000 10*6.[IU] | Freq: Once | INTRAVENOUS | Status: AC
  Administered 2017-11-19: 5 10*6.[IU] via INTRAVENOUS
  Filled 2017-11-19: qty 5

## 2017-11-19 MED ORDER — SENNOSIDES-DOCUSATE SODIUM 8.6-50 MG PO TABS
2.0000 | ORAL_TABLET | ORAL | Status: DC
  Administered 2017-11-19: 2 via ORAL
  Filled 2017-11-19: qty 2

## 2017-11-19 MED ORDER — OXYCODONE-ACETAMINOPHEN 5-325 MG PO TABS: 1.0000 | ORAL_TABLET | ORAL | Status: DC | PRN

## 2017-11-19 MED ORDER — ACETAMINOPHEN 325 MG PO TABS: 650.0000 mg | ORAL_TABLET | ORAL | Status: DC | PRN

## 2017-11-19 MED ORDER — SOD CITRATE-CITRIC ACID 500-334 MG/5ML PO SOLN: 30.0000 mL | ORAL | Status: DC | PRN

## 2017-11-19 MED ORDER — SIMETHICONE 80 MG PO CHEW: 80.0000 mg | CHEWABLE_TABLET | ORAL | Status: DC | PRN

## 2017-11-19 MED ORDER — LACTATED RINGERS IV SOLN: 500.0000 mL | INTRAVENOUS | Status: DC | PRN

## 2017-11-19 NOTE — Anesthesia Pain Management Evaluation Note (Signed)
  CRNA Pain Management Visit Note  Patient: Mary Leonard, 32 y.o., female  "Hello [facility] am [facility]. We have an anesthesia team available at all times to provide care throughout the hospital, including epidural management and anesthesia for C-section. I don't know your plan for the delivery whether it a natural birth, water birth, IV sedation, nitrous supplementation, doula or epidural, but we want to meet your pain goals."   1.Was your pain managed to your expectations on prior hospitalizations?   Yes   2.What is your expectation for pain management during this hospitalization?     Epidural  3.How can we help you reach that goal? Epidural requested  Record the patient's initial score and the patient's pain goal.   Pain: 7  Pain Goal: 0 [facility] wants you to be able to say your pain was always managed very well.  Mary Leonard 11/19/2017

## 2017-11-19 NOTE — Anesthesia Postprocedure Evaluation (Signed)
Anesthesia Post Note  Patient: Mary Leonard  Procedure(s) Performed: AN AD HOC LABOR EPIDURAL     Patient location during evaluation: Mother Baby Anesthesia Type: Epidural Level of consciousness: awake and alert Pain management: pain level controlled Vital Signs Assessment: post-procedure vital signs reviewed and stable Respiratory status: spontaneous breathing, nonlabored ventilation and respiratory function stable Cardiovascular status: stable Postop Assessment: no headache, no backache and epidural receding Anesthetic complications: no    Last Vitals:  Vitals:   11/19/17 1700 11/19/17 1745  BP: 123/77 125/82  Pulse: 74 74  Resp: 18 18  Temp:    SpO2:      Last Pain:  Vitals:   11/19/17 1605  TempSrc:   PainSc: 0-No pain   Pain Goal:                 Junious SilkGILBERT,Damen Windsor

## 2017-11-19 NOTE — MAU Provider Note (Signed)
S: Mary Leonard is a 32 y.o. G3P1011 at 4072w3d  who presents to MAU today complaining of leaking of fluid since 0800. She denies vaginal bleeding. She endorses contractions. She reports normal fetal movement.    O: BP 130/83 (BP Location: Left Arm)   Pulse 84   Temp 98.5 F (36.9 C) (Oral)   Resp 20   Ht 5\' 9"  (1.753 m)   Wt 101.3 kg   SpO2 99%   BMI 32.97 kg/m  GENERAL: Well-developed, well-nourished female in no acute distress.  HEAD: Normocephalic, atraumatic.  CHEST: Normal effort of breathing, regular heart rate ABDOMEN: Soft, nontender, gravid PELVIC: Normal external female genitalia. Vagina is pink and rugated. Cervix with normal contour, no lesions. Normal discharge.  + pooling.   Cervical exam:  4/80/-2  Fetal Monitoring: Baseline: 140 Variability: moderate Accelerations: 15x15 Decelerations: none Contractions: about q 4 mins   Results for orders placed or performed during the hospital encounter of 11/19/17 (from the past 24 hour(s))  Fern Test     Status: Abnormal   Collection Time: 11/19/17  9:42 AM  Result Value Ref Range   POCT Fern Test Positive = ruptured amniotic membanes      A: SIUP at 3172w3d  SROM  P: Report given to RN to contact MD on call for further instructions  Armando ReichertHogan, Naia Ruff D, CNM 11/19/2017 9:42 AM

## 2017-11-19 NOTE — Anesthesia Preprocedure Evaluation (Signed)
Anesthesia Evaluation  Patient identified by MRN, date of birth, ID band Patient awake    Reviewed: Allergy & Precautions, NPO status , Patient's Chart, lab work & pertinent test results  History of Anesthesia Complications (+) PONV  Airway Mallampati: I  TM Distance: >3 FB Neck ROM: Full    Dental no notable dental hx. (+) Teeth Intact   Pulmonary neg pulmonary ROS,    Pulmonary exam normal breath sounds clear to auscultation       Cardiovascular negative cardio ROS Normal cardiovascular exam Rhythm:Regular Rate:Normal     Neuro/Psych negative neurological ROS  negative psych ROS   GI/Hepatic negative GI ROS, Neg liver ROS,   Endo/Other  negative endocrine ROS  Renal/GU negative Renal ROS  negative genitourinary   Musculoskeletal negative musculoskeletal ROS (+)   Abdominal   Peds  Hematology negative hematology ROS (+)   Anesthesia Other Findings   Reproductive/Obstetrics (+) Pregnancy                             Anesthesia Physical Anesthesia Plan  ASA: II  Anesthesia Plan: Epidural   Post-op Pain Management:    Induction:   PONV Risk Score and Plan: Treatment may vary due to age or medical condition  Airway Management Planned: Natural Airway  Additional Equipment:   Intra-op Plan:   Post-operative Plan:   Informed Consent: I have reviewed the patients History and Physical, chart, labs and discussed the procedure including the risks, benefits and alternatives for the proposed anesthesia with the patient or authorized representative who has indicated his/her understanding and acceptance.     Plan Discussed with: Anesthesiologist  Anesthesia Plan Comments: (Patient identified. Risks, benefits, options discussed with patient including but not limited to bleeding, infection, nerve damage, paralysis, failed block, incomplete pain control, headache, blood pressure  changes, nausea, vomiting, reactions to medication, itching, and post partum back pain. Confirmed with bedside nurse the patient's most recent platelet count. Confirmed with the patient that they are not taking any anticoagulation, have any bleeding history or any family history of bleeding disorders. Patient expressed understanding and wishes to proceed. All questions were answered. )        Anesthesia Quick Evaluation

## 2017-11-19 NOTE — Progress Notes (Signed)
Epidural capped at this time.

## 2017-11-19 NOTE — Anesthesia Procedure Notes (Signed)
Epidural Patient location during procedure: OB Start time: 11/19/2017 1:05 PM End time: 11/19/2017 1:20 PM  Staffing Anesthesiologist: Elmer PickerWoodrum, Chelsey L, MD Performed: anesthesiologist   Preanesthetic Checklist Completed: patient identified, pre-op evaluation, timeout performed, IV checked, risks and benefits discussed and monitors and equipment checked  Epidural Patient position: sitting Prep: site prepped and draped and DuraPrep Patient monitoring: continuous pulse ox, blood pressure, heart rate and cardiac monitor Approach: midline Location: L3-L4 Injection technique: LOR air  Needle:  Needle type: Tuohy  Needle gauge: 17 G Needle length: 9 cm Needle insertion depth: 5 cm Catheter type: closed end flexible Catheter size: 19 Gauge Catheter at skin depth: 10 cm Test dose: negative  Assessment Sensory level: T8 Events: blood not aspirated, injection not painful, no injection resistance, negative IV test and no paresthesia  Additional Notes Patient identified. Risks/Benefits/Options discussed with patient including but not limited to bleeding, infection, nerve damage, paralysis, failed block, incomplete pain control, headache, blood pressure changes, nausea, vomiting, reactions to medication both or allergic, itching and postpartum back pain. Confirmed with bedside nurse the patient's most recent platelet count. Confirmed with patient that they are not currently taking any anticoagulation, have any bleeding history or any family history of bleeding disorders. Patient expressed understanding and wished to proceed. All questions were answered. Sterile technique was used throughout the entire procedure. Please see nursing notes for vital signs. Test dose was given through epidural catheter and negative prior to continuing to dose epidural or start infusion. Warning signs of high block given to the patient including shortness of breath, tingling/numbness in hands, complete motor block,  or any concerning symptoms with instructions to call for help. Patient was given instructions on fall risk and not to get out of bed. All questions and concerns addressed with instructions to call with any issues or inadequate analgesia.  Reason for block:procedure for pain

## 2017-11-19 NOTE — MAU Note (Signed)
Pt presents with c/o LOF since 0800 this morning.  Denies VB.  Reports +FM.

## 2017-11-19 NOTE — H&P (Signed)
Mary Leonard is a 32 y.o. G 3 P 1 at 3739 w 3 days presents with SROM and contractions. GBBS + OB History    Gravida  3   Para  1   Term  1   Preterm      AB  1   Living  1     SAB  1   TAB      Ectopic      Multiple  0   Live Births  1          Past Medical History:  Diagnosis Date  . History of kidney stones   . History of PCOS   . Hx of varicella   . PONV (postoperative nausea and vomiting)   . 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 DILATION AND EVACUATION N/A 05/18/2016   Procedure: DILATATION AND EVACUATION WITH ULTRASOUND GUIDANCE;  Surgeon: Mary OverlieMichelle Nathalya Wolanski, MD;  Location: [facility];  Service: Gynecology;  Laterality: N/A;  . HAND TENDON SURGERY Left 1999   middle finger  . labia minora reduction  2007  . NOSE SURGERY  2005  . WISDOM TOOTH EXTRACTION  2002   Family History: family history includes Anuerysm in her mother; Cancer in her paternal grandmother; Diabetes in her paternal grandmother; Rheum arthritis in her paternal aunt. Social History:  reports that she has never smoked. She has never used smokeless tobacco. She reports that she does not drink alcohol or use drugs.     Maternal Diabetes: No Genetic Screening: Normal Maternal Ultrasounds/Referrals: Normal Fetal Ultrasounds or other Referrals:  None Maternal Substance Abuse:  No Significant Maternal Medications:  None Significant Maternal Lab Results:  None Other Comments:  None  Review of Systems  All other systems reviewed and are negative.  Maternal Medical History:  Reason for admission: Rupture of membranes and contractions.     Dilation: 8.5 Effacement (%): 80 Station: Plus 1 Exam by:: Mary Leonard Blood pressure 115/76, pulse 80, temperature 98.5 F (36.9 C), temperature source Oral, resp. rate 18, height 5\' 9"  (1.753 m), weight 101.3 kg, SpO2 99 %, unknown if currently  breastfeeding. Maternal Exam:  Uterine Assessment: Contraction strength is moderate.  Contraction frequency is regular.   Abdomen: Fetal presentation: vertex     Fetal Exam Fetal State Assessment: Category I - tracings are normal.     Physical Exam  Nursing note and vitals reviewed. Constitutional: She appears well-developed and well-nourished.  HENT:  Head: Normocephalic.  Eyes: Pupils are equal, round, and reactive to light.  Neck: Normal range of motion.  Cardiovascular: Normal rate and regular rhythm.    Prenatal labs: ABO, Rh: --/--/O POS (09/13 1025) Antibody: NEG (09/13 1025) Rubella:   RPR:    HBsAg:    HIV:    GBS:     Assessment/Plan: IUP at term LABOR Anticipate NSVD GBBS + antibiotics for prophylaxis Mary Leonard L 11/19/2017, 1:53 PM

## 2017-11-20 LAB — CBC
HEMATOCRIT: 38.4 % (ref 36.0–46.0)
Hemoglobin: 12.9 g/dL (ref 12.0–15.0)
MCH: 31.5 pg (ref 26.0–34.0)
MCHC: 33.6 g/dL (ref 30.0–36.0)
MCV: 93.7 fL (ref 78.0–100.0)
PLATELETS: 174 10*3/uL (ref 150–400)
RBC: 4.1 MIL/uL (ref 3.87–5.11)
RDW: 13.4 % (ref 11.5–15.5)
WBC: 11.3 10*3/uL — ABNORMAL HIGH (ref 4.0–10.5)

## 2017-11-20 LAB — RPR: RPR Ser Ql: NONREACTIVE

## 2017-11-20 NOTE — Lactation Note (Signed)
This note was copied from a baby's chart. Lactation Consultation Note  Patient Name: Mary Leonard ZOXWR'UToday's Date: 11/20/2017 Reason for consult: Follow-up assessment;Term  P2 mother whose infant is now 3921 hours old.  Mother is awaiting early discharge.  Mother has no questions/concerns related to breast feeding.  She feels like baby is latching well and breasts and nipples are non tender.  Engorgement prevention/treatment discussed.  Mother has DEBP for home use.  She will call for any questions prior to discharge later today.  RN in room.   Maternal Data Formula Feeding for Exclusion: No Has patient been taught Hand Expression?: Yes Does the patient have breastfeeding experience prior to this delivery?: Yes  Feeding    LATCH Score                   Interventions    Lactation Tools Discussed/Used WIC Program: No   Consult Status Consult Status: Complete    Mary Leonard 11/20/2017, 12:32 PM

## 2017-11-20 NOTE — Progress Notes (Addendum)
Rn called Dr. Grewal regarding patient's unknown MMR status due to patient's lab records not able to be transferred to the hospital at this point. Dr. Grewal stated that she is the only physician that has taken care of the patient and that the patient is rubella immune.  The Rn discussed the MMR information with patient and Rn  stated to patient to follow up with Dr. Grewal's office with confirmation (records) of the rubella status on Monday.   The Rn discussed this with pediatrician  O' kelley , the baby will be a baby patient and mom will be discharged.          

## 2017-11-20 NOTE — Discharge Summary (Signed)
Obstetric Discharge Summary Reason for Admission: rupture of membranes Prenatal Procedures: none Intrapartum Procedures: spontaneous vaginal delivery Postpartum Procedures: none Complications-Operative and Postpartum: first degree perineal laceration Hemoglobin  Date Value Ref Range Status  11/20/2017 12.9 12.0 - 15.0 g/dL Final   HCT  Date Value Ref Range Status  11/20/2017 38.4 36.0 - 46.0 % Final    Physical Exam:  General: alert, cooperative and appears stated age 70Lochia: appropriate Uterine Fundus: firm Incision: healing well, no significant drainage, no dehiscence DVT Evaluation: No evidence of DVT seen on physical exam.  Discharge Diagnoses: Term Pregnancy-delivered  Discharge Information: Date: 11/20/2017 Activity: pelvic rest Diet: routine Medications: None Condition: improved Instructions: refer to practice specific booklet Discharge to: home   Newborn Data: Live born female  Birth Weight: 8 lb 12.4 oz (3980 g) APGAR: 9, 9  Newborn Delivery   Birth date/time:  11/19/2017 14:57:00 Delivery type:  Vaginal, Spontaneous     Home with mother.  Raissa Dam L 11/20/2017, 7:21 AM

## 2017-11-20 NOTE — Lactation Note (Signed)
This note was copied from a baby's chart. Lactation Consultation Note  Patient Name: Mary Janalyn RouseWhitney Larocque ZOXWR'UToday's Date: 11/20/2017 Reason for consult: Follow-up assessment;Term  P2 mother whose infant is now 6830 hours old  Mother was breastfeeding as I entered the room.  Mother's breasts are soft and non tender.  Baby had a good rhythmic suck with flanged lips.  Many audible swallows noted.  I observed her feeding for 7 minutes before she self released.  Mother burped her and offered the opposite breast.  Baby content and not interested in feeding any longer.    Encouraged mother to continue feeding 8-12 times/24 hours or sooner if baby shows cues.  Mother will do hand expression before/after feedings.  Encouraged to call her RN tonight for any questions.  Family present.  RN updated.   Maternal Data Formula Feeding for Exclusion: No Has patient been taught Hand Expression?: Yes Does the patient have breastfeeding experience prior to this delivery?: Yes  Feeding Feeding Type: Breast Fed Length of feed: 7 min  LATCH Score Latch: Grasps breast easily, tongue down, lips flanged, rhythmical sucking.  Audible Swallowing: Spontaneous and intermittent  Type of Nipple: Everted at rest and after stimulation  Comfort (Breast/Nipple): Soft / non-tender  Hold (Positioning): Assistance needed to correctly position infant at breast and maintain latch.  LATCH Score: 9  Interventions Interventions: Breast feeding basics reviewed;Assisted with latch;Skin to skin;Breast massage;Hand express;Pre-pump if needed;Position options;Support pillows;Adjust position;Breast compression;Hand pump  Lactation Tools Discussed/Used WIC Program: No   Consult Status Consult Status: Follow-up Date: 11/21/17 Follow-up type: In-patient    Brentlee Delage R Laurynn Mccorvey 11/20/2017, 9:01 PM

## 2017-11-21 ENCOUNTER — Ambulatory Visit: Payer: Self-pay

## 2017-11-21 NOTE — Lactation Note (Signed)
This note was copied from a baby's chart. Lactation Consultation Note: Experienced BF mom reports breast feeding is going well. Reports much better than her first. No questions at present. Reviewed our phone number to call with questions.  Patient Name: Mary Leonard: 11/21/2017 Reason for consult: Follow-up assessment   Maternal Data Formula Feeding for Exclusion: No Has patient been taught Hand Expression?: Yes Does the patient have breastfeeding experience prior to this delivery?: Yes  Feeding    LATCH Score                   Interventions    Lactation Tools Discussed/Used     Consult Status Consult Status: Complete    Pamelia HoitWeeks, Leonel Mccollum D 11/21/2017, 10:11 AM

## 2018-08-23 ENCOUNTER — Ambulatory Visit: Payer: 59

## 2018-08-23 ENCOUNTER — Other Ambulatory Visit: Payer: Self-pay

## 2018-08-23 ENCOUNTER — Ambulatory Visit (INDEPENDENT_AMBULATORY_CARE_PROVIDER_SITE_OTHER): Payer: 59 | Admitting: Physician Assistant

## 2018-08-23 ENCOUNTER — Encounter: Payer: Self-pay | Admitting: Physician Assistant

## 2018-08-23 DIAGNOSIS — M542 Cervicalgia: Secondary | ICD-10-CM | POA: Diagnosis not present

## 2018-08-23 MED ORDER — CYCLOBENZAPRINE HCL 10 MG PO TABS: 10.0000 mg | ORAL_TABLET | Freq: Every day | ORAL | 0 refills | Status: DC

## 2018-08-23 MED ORDER — METHYLPREDNISOLONE 4 MG PO TABS: ORAL_TABLET | ORAL | 0 refills | Status: DC

## 2018-08-23 NOTE — Progress Notes (Signed)
Office Visit Note   Patient: Mary Leonard           Date of Birth: [birth year]           MRN: [id number] Visit Date: 08/23/2018              Requested by: Mary Leonard, [place],   [id number] PCP: Mary Queen, MD   Assessment & Plan: Visit Diagnoses:  1. Cervicalgia     Plan: Plan we will place her on Flexeril nightly.  Also placed on a Medrol Dosepak.  No NSAIDs while on this.  Moist heat to the neck.  She is given neck exercises and these are reviewed with her.  She may benefit from formal therapy in the future and she will call our office.  Like to see her back in 2 weeks to check her response to this treatment if she is doing well she can call and cancel. Would you be formal physical therapy or an MRI to rule out HNP as a source of her radicular pain down her left arm.  Questions were encouraged and answered at length.  Follow-Up Instructions: Return in about 2 weeks (around 09/06/2018).   Orders:  Orders Placed This Encounter  Procedures  . XR Cervical Spine 2 or 3 views   Meds ordered this encounter  Medications  . methylPREDNISolone (MEDROL) 4 MG tablet    Sig: Take as directed    Dispense:  21 tablet    Refill:  0  . cyclobenzaprine (FLEXERIL) 10 MG tablet    Sig: Take 1 tablet (10 mg total) by mouth at bedtime.    Dispense:  30 tablet    Refill:  0      Procedures: No procedures performed   Clinical Data: No additional findings.   Subjective: Chief Complaint  Patient presents with  . Left Shoulder - Pain    HPI Mary Leonard is a 33 year old female comes in today for left shoulder pain and arm pain.  She is also had left sided neck pain since Mother's Day.  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  She notes some numbness down to her left elbow.  States the pain initially began in her neck she tried some ibuprofen without any real relief.  She was seen for trigger point  back in 2009 on the left side but has not been seen in our office since that time.  She denies any pregnancy at this time or breast-feeding.  Review of Systems Denies any fevers , chills or chest pain.  Objective: Vital Signs: There were no vitals taken for this visit.  Physical Exam Constitutional:      Appearance: She is normal weight. She is not ill-appearing or diaphoretic.  Pulmonary:     Effort: Pulmonary effort is normal.  Neurological:     Mental Status: She is alert and oriented to person, place, and time.  Psychiatric:        Mood and Affect: Mood normal.        Behavior: Behavior normal.     Ortho Exam Upper extremity she has 5 out of 5 strength throughout the upper extremities against resistance.  Bilateral hands full motor full sensation.  Radial pulses are 2+ bilaterally equal symmetric.  Full range of motion bilateral shoulders without pain.  Cervical spine she has full flexion with some slight discomfort extension causes discomfort but no radicular symptoms.  Positive Spurling's.  Tenderness medial border of left clavicle only. Specialty Comments:  No specialty comments available.  Imaging: Xr Cervical Spine 2 Or 3 Views  Result Date: 08/23/2018 Cervical spine AP and lateral views show loss of lordotic curvature.  Disc space are well-maintained.  No acute fractures.  No spondylolisthesis.  No bony abnormalities otherwise.  Apices of the lungs appear clear on the AP view.    PMFS History: Patient Active Problem List   Diagnosis Date Noted  . Normal labor 11/19/2017  . Indication for care in labor or delivery 05/18/2014  . NSVD (normal spontaneous vaginal delivery) 05/18/2014  . Recurrent kidney stones 06/28/2011  . 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 History of kidney stones   . History of PCOS   . Hx of varicella   . PONV (postoperative nausea and vomiting)   . 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 Rheum arthritis Paternal Aunt   . Diabetes Paternal Grandmother   . Cancer Paternal Grandmother        breast  . Anuerysm Mother     Past Surgical History:  Procedure Laterality Date  . DILATION AND EVACUATION N/A 05/18/2016   Procedure: DILATATION AND EVACUATION WITH ULTRASOUND GUIDANCE;  Surgeon: Marcelle OverlieMichelle Grewal, MD;  Location: [facility];  Service: Gynecology;  Laterality: N/A;  . HAND TENDON SURGERY Left 1999   middle finger  . labia minora reduction  2007  . NOSE SURGERY  2005  . WISDOM TOOTH EXTRACTION  2002   Social History   Occupational History  . Not on file  Tobacco Use  . Smoking status: Never Smoker  . Smokeless tobacco: Never Used  Substance and Sexual Activity  . Alcohol use: No    Comment: occasional  . Drug use: No  . Sexual activity: Yes    Birth control/protection: None

## 2018-09-19 ENCOUNTER — Ambulatory Visit: Payer: 59 | Admitting: Physician Assistant

## 2021-03-09 NOTE — L&D Delivery Note (Signed)
Operative Delivery Note At 10:18 PM a viable female was delivered via Vaginal, Spontaneous.  Presentation: vertex; Position: Occiput,, Posterior; Station: +2.  Verbal consent: obtained from patient.  Risks and benefits discussed in detail.  Risks include, but are not limited to the risks of anesthesia, bleeding, infection, damage to maternal tissues, fetal cephalhematoma.  There is also the risk of inability to effect vaginal delivery of the head, or shoulder dystocia that cannot be resolved by established maneuvers, leading to the need for emergency cesarean section.  APGAR: 9 and 9 weight  pending.   Placenta status: spontaneous with 3 vessel cord and loose nuchal cord  Cord:  with the following complications: loose nuchal cord.  Cord pH: not obtained  Anesthesia:  epidural Instruments: mushroom with 1 pull and no popoff Episiotomy: None Lacerations: None Suture Repair:  not applicable Est. Blood Loss (mL): 300  Mom to postpartum.  Baby to Couplet care / Skin to Skin.  Jeani Hawking 03/05/2022, 10:27 PM

## 2021-08-08 LAB — OB RESULTS CONSOLE RUBELLA ANTIBODY, IGM: Rubella: IMMUNE

## 2021-08-08 LAB — HEPATITIS C ANTIBODY: HCV Ab: NEGATIVE

## 2021-08-08 LAB — OB RESULTS CONSOLE HIV ANTIBODY (ROUTINE TESTING): HIV: NONREACTIVE

## 2021-08-08 LAB — OB RESULTS CONSOLE HEPATITIS B SURFACE ANTIGEN: Hepatitis B Surface Ag: NEGATIVE

## 2021-08-08 LAB — OB RESULTS CONSOLE RPR: RPR: NONREACTIVE

## 2021-08-29 LAB — OB RESULTS CONSOLE GC/CHLAMYDIA: Chlamydia: NEGATIVE

## 2022-03-04 LAB — OB RESULTS CONSOLE GBS: GBS: POSITIVE

## 2022-03-05 ENCOUNTER — Other Ambulatory Visit: Payer: Self-pay

## 2022-03-05 ENCOUNTER — Encounter (HOSPITAL_COMMUNITY): Payer: Self-pay | Admitting: Obstetrics and Gynecology

## 2022-03-05 ENCOUNTER — Inpatient Hospital Stay (HOSPITAL_COMMUNITY): Payer: 59 | Admitting: Anesthesiology

## 2022-03-05 ENCOUNTER — Inpatient Hospital Stay (HOSPITAL_COMMUNITY)
Admission: AD | Admit: 2022-03-05 | Discharge: 2022-03-07 | DRG: 807 | Disposition: A | Discharge status: Home / Self Care | Payer: 59 | Attending: Obstetrics and Gynecology | Admitting: Obstetrics and Gynecology

## 2022-03-05 DIAGNOSIS — Z87442 Personal history of urinary calculi: Secondary | ICD-10-CM | POA: Diagnosis not present

## 2022-03-05 DIAGNOSIS — O26893 Other specified pregnancy related conditions, third trimester: Secondary | ICD-10-CM | POA: Diagnosis present

## 2022-03-05 DIAGNOSIS — Z3A38 38 weeks gestation of pregnancy: Secondary | ICD-10-CM | POA: Diagnosis not present

## 2022-03-05 DIAGNOSIS — O4292 Full-term premature rupture of membranes, unspecified as to length of time between rupture and onset of labor: Principal | ICD-10-CM | POA: Diagnosis present

## 2022-03-05 LAB — POCT FERN TEST: POCT Fern Test: POSITIVE

## 2022-03-05 LAB — CBC
HCT: 39.3 % (ref 36.0–46.0)
Hemoglobin: 13.6 g/dL (ref 12.0–15.0)
MCH: 31.5 pg (ref 26.0–34.0)
MCHC: 34.6 g/dL (ref 30.0–36.0)
MCV: 91 fL (ref 80.0–100.0)
Platelets: 175 10*3/uL (ref 150–400)
RBC: 4.32 MIL/uL (ref 3.87–5.11)
RDW: 12.9 % (ref 11.5–15.5)
WBC: 8.9 10*3/uL (ref 4.0–10.5)
nRBC: 0 % (ref 0.0–0.2)

## 2022-03-05 LAB — TYPE AND SCREEN
ABO/RH(D): O POS
Antibody Screen: NEGATIVE

## 2022-03-05 MED ORDER — ACETAMINOPHEN 325 MG PO TABS: 650.0000 mg | ORAL_TABLET | ORAL | Status: DC | PRN

## 2022-03-05 MED ORDER — OXYTOCIN-SODIUM CHLORIDE 30-0.9 UT/500ML-% IV SOLN: 2.5000 [IU]/h | INTRAVENOUS | Status: DC

## 2022-03-05 MED ORDER — PHENYLEPHRINE 80 MCG/ML (10ML) SYRINGE FOR IV PUSH (FOR BLOOD PRESSURE SUPPORT)
80.0000 ug | PREFILLED_SYRINGE | INTRAVENOUS | Status: DC | PRN
  Administered 2022-03-05: 80 ug via INTRAVENOUS

## 2022-03-05 MED ORDER — OXYCODONE-ACETAMINOPHEN 5-325 MG PO TABS: 2.0000 | ORAL_TABLET | ORAL | Status: DC | PRN

## 2022-03-05 MED ORDER — FENTANYL CITRATE (PF) 100 MCG/2ML IJ SOLN
100.0000 ug | INTRAMUSCULAR | Status: DC | PRN
  Administered 2022-03-05: 100 ug via INTRAVENOUS
  Filled 2022-03-05: qty 2

## 2022-03-05 MED ORDER — PHENYLEPHRINE 80 MCG/ML (10ML) SYRINGE FOR IV PUSH (FOR BLOOD PRESSURE SUPPORT)
80.0000 ug | PREFILLED_SYRINGE | INTRAVENOUS | Status: DC | PRN
  Filled 2022-03-05: qty 10

## 2022-03-05 MED ORDER — EPHEDRINE 5 MG/ML INJ: 10.0000 mg | INTRAVENOUS | Status: DC | PRN

## 2022-03-05 MED ORDER — FENTANYL-BUPIVACAINE-NACL 0.5-0.125-0.9 MG/250ML-% EP SOLN
EPIDURAL | Status: DC | PRN
  Administered 2022-03-05: 12 mL/h via EPIDURAL

## 2022-03-05 MED ORDER — SOD CITRATE-CITRIC ACID 500-334 MG/5ML PO SOLN: 30.0000 mL | ORAL | Status: DC | PRN

## 2022-03-05 MED ORDER — FLEET ENEMA 7-19 GM/118ML RE ENEM: 1.0000 | ENEMA | RECTAL | Status: DC | PRN

## 2022-03-05 MED ORDER — LACTATED RINGERS IV SOLN: 500.0000 mL | Freq: Once | INTRAVENOUS | Status: AC

## 2022-03-05 MED ORDER — TERBUTALINE SULFATE 1 MG/ML IJ SOLN: 0.2500 mg | Freq: Once | INTRAMUSCULAR | Status: DC | PRN

## 2022-03-05 MED ORDER — LACTATED RINGERS IV SOLN: 500.0000 mL | INTRAVENOUS | Status: DC | PRN

## 2022-03-05 MED ORDER — OXYTOCIN-SODIUM CHLORIDE 30-0.9 UT/500ML-% IV SOLN
1.0000 m[IU]/min | INTRAVENOUS | Status: DC
  Administered 2022-03-05: 2 m[IU]/min via INTRAVENOUS
  Filled 2022-03-05: qty 500

## 2022-03-05 MED ORDER — OXYCODONE-ACETAMINOPHEN 5-325 MG PO TABS: 1.0000 | ORAL_TABLET | ORAL | Status: DC | PRN

## 2022-03-05 MED ORDER — OXYTOCIN BOLUS FROM INFUSION
333.0000 mL | Freq: Once | INTRAVENOUS | Status: AC
  Administered 2022-03-05: 333 mL via INTRAVENOUS

## 2022-03-05 MED ORDER — FENTANYL-BUPIVACAINE-NACL 0.5-0.125-0.9 MG/250ML-% EP SOLN
12.0000 mL/h | EPIDURAL | Status: DC | PRN
  Filled 2022-03-05: qty 250

## 2022-03-05 MED ORDER — SODIUM CHLORIDE 0.9 % IV SOLN
2.0000 g | Freq: Once | INTRAVENOUS | Status: AC
  Administered 2022-03-05: 2 g via INTRAVENOUS
  Filled 2022-03-05: qty 2000

## 2022-03-05 MED ORDER — DIPHENHYDRAMINE HCL 50 MG/ML IJ SOLN: 12.5000 mg | INTRAMUSCULAR | Status: DC | PRN

## 2022-03-05 MED ORDER — LIDOCAINE HCL (PF) 1 % IJ SOLN
INTRAMUSCULAR | Status: DC | PRN
  Administered 2022-03-05: 2 mL via EPIDURAL
  Administered 2022-03-05: 10 mL via EPIDURAL

## 2022-03-05 MED ORDER — ONDANSETRON HCL 4 MG/2ML IJ SOLN: 4.0000 mg | Freq: Four times a day (QID) | INTRAMUSCULAR | Status: DC | PRN

## 2022-03-05 MED ORDER — SODIUM CHLORIDE 0.9 % IV SOLN: 1.0000 g | INTRAVENOUS | Status: DC

## 2022-03-05 MED ORDER — LACTATED RINGERS IV SOLN: INTRAVENOUS | Status: DC

## 2022-03-05 MED ORDER — LIDOCAINE HCL (PF) 1 % IJ SOLN: 30.0000 mL | INTRAMUSCULAR | Status: DC | PRN

## 2022-03-05 NOTE — Lactation Note (Signed)
This note was copied from a baby's chart. Lactation Consultation Note  Patient Name: Girl Natsuko Kelsay FIEPP'I Date: 03/05/2022   Age:36 hours Per RN [place] Dec), Birth Parent declined [facility] services in L&D and would like to be seen later on MBU.  Maternal Data    Feeding    LATCH Score                    Lactation Tools Discussed/Used    Interventions    Discharge    Consult Status      Frederico Hamman 03/05/2022, 11:01 PM

## 2022-03-05 NOTE — MAU Note (Signed)
[name] is a 36 y.o. at [redacted]w[redacted]d here in MAU reporting: contractions started this morning and is seeing some bloody mucus. Contractions were more regular this morning and were every 5-10 minutes and they feel more irregular now and are very mild. Was 3cm yesterday. Unsure if LOF, states she has been feeling some wetness.   Onset of complaint: today  Pain score: 2/10  Vitals:   03/05/22 1420  BP: 112/77  Pulse: (!) 110  Resp: 16  Temp: 97.8 F (36.6 C)  SpO2: 99%     FHT:156  Lab orders placed from triage: none

## 2022-03-05 NOTE — Anesthesia Procedure Notes (Signed)
Epidural Patient location during procedure: OB Start time: 03/05/2022 7:56 PM End time: 03/05/2022 8:05 PM  Staffing Anesthesiologist: Lannie Fields, DO Performed: anesthesiologist   Preanesthetic Checklist Completed: patient identified, IV checked, risks and benefits discussed, monitors and equipment checked, pre-op evaluation and timeout performed  Epidural Patient position: sitting Prep: DuraPrep and site prepped and draped Patient monitoring: continuous pulse ox, blood pressure, heart rate and cardiac monitor Approach: midline Location: L3-L4 Injection technique: LOR air  Needle:  Needle type: Tuohy  Needle gauge: 17 G Needle length: 9 cm Needle insertion depth: 5 cm Catheter type: closed end flexible Catheter size: 19 Gauge Catheter at skin depth: 10 cm Test dose: negative  Assessment Sensory level: T8 Events: blood not aspirated, no cerebrospinal fluid, injection not painful, no injection resistance, no paresthesia and negative IV test  Additional Notes Patient identified. Risks/Benefits/Options discussed with patient including but not limited to bleeding, infection, nerve damage, paralysis, failed block, incomplete pain control, headache, blood pressure changes, nausea, vomiting, reactions to medication both or allergic, itching and postpartum back pain. Confirmed with bedside nurse the patient's most recent platelet count. Confirmed with patient that they are not currently taking any anticoagulation, have any bleeding history or any family history of bleeding disorders. Patient expressed understanding and wished to proceed. All questions were answered. Sterile technique was used throughout the entire procedure. Please see nursing notes for vital signs. Test dose was given through epidural catheter and negative prior to continuing to dose epidural or start infusion. Warning signs of high block given to the patient including shortness of breath, tingling/numbness in  hands, complete motor block, or any concerning symptoms with instructions to call for help. Patient was given instructions on fall risk and not to get out of bed. All questions and concerns addressed with instructions to call with any issues or inadequate analgesia.  Reason for block:procedure for pain

## 2022-03-05 NOTE — H&P (Signed)
Mary Leonard is a 36 y.o. 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 Social History:  reports that she has never smoked. She has never used smokeless tobacco. She reports that she does not drink alcohol and does not use drugs.     Maternal Diabetes: No Genetic Screening: Normal Maternal Ultrasounds/Referrals: Normal Fetal Ultrasounds or other Referrals:  None Maternal Substance Abuse:  No Significant Maternal Medications:  None Significant Maternal Lab Results:  None Number of Prenatal Visits:greater than 3 verified prenatal visits Other Comments:  None  Review of Systems  All other systems reviewed and are negative.  Maternal Medical History:  Reason for admission: Rupture of membranes and contractions.   Prenatal complications: no prenatal complications     Blood pressure 112/77, pulse (!) 110, temperature 97.8 F  (36.6 C), temperature source Oral, resp. rate 16, height 5\' 9"  (1.753 m), weight 103.6 kg, SpO2 99 %, unknown if currently breastfeeding. Maternal Exam:  Uterine Assessment: Contraction strength is mild.  Contraction frequency is irregular.  Abdomen: Fetal presentation: vertex   Fetal Exam Fetal State Assessment: Category I - tracings are normal.   Physical Exam Vitals and nursing note reviewed. Exam conducted with a chaperone present.  Constitutional:      Appearance: Normal appearance.  HENT:     Head: Normocephalic.  Cardiovascular:     Rate and Rhythm: Normal rate and regular rhythm.  Neurological:     Mental Status: She is alert.     Prenatal labs: ABO, Rh:   Antibody:   Rubella:   RPR:    HBsAg:    HIV:    GBS:     Assessment/Plan: IUP at term SROM LABOR  Admit  Epidural Possible need for Pitocin augmentation  03/05/2022, 3:06 PM

## 2022-03-05 NOTE — Anesthesia Preprocedure Evaluation (Signed)
Anesthesia Evaluation  Patient identified by MRN, date of birth, ID band Patient awake    Reviewed: Allergy & Precautions, Patient's Chart, lab work & pertinent test results  History of Anesthesia Complications (+) PONV and history of anesthetic complications  Airway Mallampati: II  TM Distance: >3 FB Neck ROM: Full    Dental no notable dental hx.    Pulmonary neg pulmonary ROS   Pulmonary exam normal breath sounds clear to auscultation       Cardiovascular negative cardio ROS Normal cardiovascular exam Rhythm:Regular Rate:Normal     Neuro/Psych negative neurological ROS  negative psych ROS   GI/Hepatic negative GI ROS, Neg liver ROS,,,  Endo/Other  negative endocrine ROS    Renal/GU negative Renal ROS  negative genitourinary   Musculoskeletal negative musculoskeletal ROS (+)    Abdominal   Peds negative pediatric ROS (+)  Hematology negative hematology ROS (+)   Anesthesia Other Findings   Reproductive/Obstetrics (+) Pregnancy                             Anesthesia Physical Anesthesia Plan  ASA: 2  Anesthesia Plan: Epidural   Post-op Pain Management:    Induction:   PONV Risk Score and Plan: 2  Airway Management Planned: Natural Airway  Additional Equipment: None  Intra-op Plan:   Post-operative Plan:   Informed Consent: I have reviewed the patients History and Physical, chart, labs and discussed the procedure including the risks, benefits and alternatives for the proposed anesthesia with the patient or authorized representative who has indicated his/her understanding and acceptance.       Plan Discussed with:   Anesthesia Plan Comments:        Anesthesia Quick Evaluation

## 2022-03-06 LAB — CBC
HCT: 38.3 % (ref 36.0–46.0)
Hemoglobin: 12.8 g/dL (ref 12.0–15.0)
MCH: 30.6 pg (ref 26.0–34.0)
MCHC: 33.4 g/dL (ref 30.0–36.0)
MCV: 91.6 fL (ref 80.0–100.0)
Platelets: 157 10*3/uL (ref 150–400)
RBC: 4.18 MIL/uL (ref 3.87–5.11)
RDW: 13.2 % (ref 11.5–15.5)
WBC: 12.5 10*3/uL — ABNORMAL HIGH (ref 4.0–10.5)
nRBC: 0 % (ref 0.0–0.2)

## 2022-03-06 LAB — RPR: RPR Ser Ql: NONREACTIVE

## 2022-03-06 MED ORDER — TETANUS-DIPHTH-ACELL PERTUSSIS 5-2.5-18.5 LF-MCG/0.5 IM SUSY: 0.5000 mL | PREFILLED_SYRINGE | Freq: Once | INTRAMUSCULAR | Status: DC

## 2022-03-06 MED ORDER — ACETAMINOPHEN 325 MG PO TABS
650.0000 mg | ORAL_TABLET | ORAL | Status: DC | PRN
  Administered 2022-03-06 – 2022-03-07 (×7): 650 mg via ORAL
  Filled 2022-03-06 (×7): qty 2

## 2022-03-06 MED ORDER — WITCH HAZEL-GLYCERIN EX PADS: 1.0000 | MEDICATED_PAD | CUTANEOUS | Status: DC | PRN

## 2022-03-06 MED ORDER — ONDANSETRON HCL 4 MG/2ML IJ SOLN: 4.0000 mg | INTRAMUSCULAR | Status: DC | PRN

## 2022-03-06 MED ORDER — IBUPROFEN 600 MG PO TABS
600.0000 mg | ORAL_TABLET | Freq: Four times a day (QID) | ORAL | Status: DC
  Administered 2022-03-06 – 2022-03-07 (×5): 600 mg via ORAL
  Filled 2022-03-06 (×7): qty 1

## 2022-03-06 MED ORDER — COCONUT OIL OIL: 1.0000 | TOPICAL_OIL | Status: DC | PRN

## 2022-03-06 MED ORDER — SENNOSIDES-DOCUSATE SODIUM 8.6-50 MG PO TABS
2.0000 | ORAL_TABLET | Freq: Every day | ORAL | Status: DC
  Administered 2022-03-06 – 2022-03-07 (×2): 2 via ORAL
  Filled 2022-03-06 (×2): qty 2

## 2022-03-06 MED ORDER — FLEET ENEMA 7-19 GM/118ML RE ENEM: 1.0000 | ENEMA | Freq: Every day | RECTAL | Status: DC | PRN

## 2022-03-06 MED ORDER — DIPHENHYDRAMINE HCL 25 MG PO CAPS: 25.0000 mg | ORAL_CAPSULE | Freq: Four times a day (QID) | ORAL | Status: DC | PRN

## 2022-03-06 MED ORDER — BENZOCAINE-MENTHOL 20-0.5 % EX AERO
1.0000 | INHALATION_SPRAY | CUTANEOUS | Status: DC | PRN
  Administered 2022-03-07: 1 via TOPICAL
  Filled 2022-03-06: qty 56

## 2022-03-06 MED ORDER — PRENATAL MULTIVITAMIN CH
1.0000 | ORAL_TABLET | Freq: Every day | ORAL | Status: DC
  Administered 2022-03-07: 1 via ORAL
  Filled 2022-03-06 (×2): qty 1

## 2022-03-06 MED ORDER — DIBUCAINE (PERIANAL) 1 % EX OINT: 1.0000 | TOPICAL_OINTMENT | CUTANEOUS | Status: DC | PRN

## 2022-03-06 MED ORDER — MEDROXYPROGESTERONE ACETATE 150 MG/ML IM SUSP: 150.0000 mg | INTRAMUSCULAR | Status: DC | PRN

## 2022-03-06 MED ORDER — ZOLPIDEM TARTRATE 5 MG PO TABS: 5.0000 mg | ORAL_TABLET | Freq: Every evening | ORAL | Status: DC | PRN

## 2022-03-06 MED ORDER — ONDANSETRON HCL 4 MG PO TABS: 4.0000 mg | ORAL_TABLET | ORAL | Status: DC | PRN

## 2022-03-06 MED ORDER — SIMETHICONE 80 MG PO CHEW: 80.0000 mg | CHEWABLE_TABLET | ORAL | Status: DC | PRN

## 2022-03-06 MED ORDER — MEASLES, MUMPS & RUBELLA VAC IJ SOLR: 0.5000 mL | Freq: Once | INTRAMUSCULAR | Status: DC

## 2022-03-06 MED ORDER — BISACODYL 10 MG RE SUPP: 10.0000 mg | Freq: Every day | RECTAL | Status: DC | PRN

## 2022-03-06 NOTE — Lactation Note (Signed)
This note was copied from a baby's chart. Lactation Consultation Note  Patient Name: Mary Leonard Date: 03/06/2022 Reason for consult: Initial assessment;Mother's request;Difficult latch Age:36 hours Birth Parent latched infant on her right breast using the football hold position, after few attempts infant started sustaining the latch and was still breastfeeding after 11 minutes when LC left the room. Infant has slight recess chins but opens her mouth wide, did not have extend infant's lower jaw for latching. Birth Parent will continue to breastfeed infant according to hunger cues, on demand, 8 to 12+ times within 24 hours, STS. Birth Parent knows to call RN/LC for further latch assistance if needed. LC discussed importance of maternal diet, rest and hydration with Birth Parent. LC discussed infant's input and out put with Birth Parent. Mom made aware of O/P services, breastfeeding support groups, community resources, and our phone # for post-discharge questions.   Maternal Data Has patient been taught Hand Expression?: Yes Does the patient have breastfeeding experience prior to this delivery?: Yes How long did the patient breastfeed?: Per Birth Parent, BF 6 months 1st child and 5-6 months with her 2nd child who is currently 29 years old.  Feeding Mother's Current Feeding Choice: Breast Milk  LATCH Score Latch: Repeated attempts needed to sustain latch, nipple held in mouth throughout feeding, stimulation needed to elicit sucking reflex. (after 5 minutes infant started sustaining latch.)  Audible Swallowing: A few with stimulation  Type of Nipple: Everted at rest and after stimulation (short shafted, did reverse pressure soften and infant latched well.)  Comfort (Breast/Nipple): Soft / non-tender  Hold (Positioning): Assistance needed to correctly position infant at breast and maintain latch.  LATCH Score: 7   Lactation Tools Discussed/Used     Interventions Interventions: Breast feeding basics reviewed;Assisted with latch;Support pillows;Adjust position;Position options;Skin to skin;Hand express;Reverse pressure;Breast compression  Discharge Pump: DEBP (Birth Parent had DEBP but could not remember the brand name.)  Consult Status Consult Status: Follow-up Date: 03/06/22 Follow-up type: In-patient    Frederico Hamman 03/06/2022, 1:17 AM

## 2022-03-06 NOTE — Anesthesia Postprocedure Evaluation (Signed)
Anesthesia Post Note  Patient: Mary Leonard  Procedure(s) Performed: AN AD HOC LABOR EPIDURAL     Patient location during evaluation: Mother Baby Anesthesia Type: Epidural Level of consciousness: awake and alert and oriented Pain management: satisfactory to patient Vital Signs Assessment: post-procedure vital signs reviewed and stable Respiratory status: respiratory function stable Cardiovascular status: stable Postop Assessment: no headache, no backache, epidural receding, patient able to bend at knees, no signs of nausea or vomiting, adequate PO intake and able to ambulate Anesthetic complications: no   No notable events documented.  Last Vitals:  Vitals:   03/06/22 0154 03/06/22 0546  BP: 101/65 110/75  Pulse: 100 79  Resp: 18 18  Temp: 36.7 C 36.6 C  SpO2: 98% 98%    Last Pain:  Vitals:   03/06/22 0546  TempSrc: Oral  PainSc: 1    Pain Goal:                   Carlos American

## 2022-03-06 NOTE — Progress Notes (Signed)
Post Partum Day 1 s/p VAVD (SROM, augmentation with pitocin) Subjective: no complaints, up ad lib, voiding, and tolerating PO  Objective: Blood pressure 110/75, pulse 79, temperature 97.8 F (36.6 C), temperature source Oral, resp. rate 18, height 5\' 9"  (1.753 m), weight 103.6 kg, SpO2 98 %, unknown if currently breastfeeding.  Physical Exam:  General: alert Lochia: appropriate Uterine Fundus: firm Incision: N/A DVT Evaluation: No evidence of DVT seen on physical exam.  Recent Labs    03/05/22 1525 03/06/22 0559  HGB 13.6 12.8  HCT 39.3 38.3    Assessment/Plan: Plan for discharge tomorrow and Breastfeeding   LOS: 1 day   03/08/22, MD 03/06/2022, 8:59 AM

## 2022-03-07 NOTE — Lactation Note (Signed)
This note was copied from a baby's chart. Lactation Consultation Note  Patient Name: Mary Leonard Date: 03/07/2022 Reason for consult: Follow-up assessment;Early term 37-38.6wks Age:36 hours  LC in to visit with P3 Mom of ET infant delivered vaginally.  Baby has been exclusively breastfeeding and Mom denies any difficulty with latching.  Baby is at a 5.8% weight loss and has good output.  Mom reports that this baby has had the best latch after birth.  Mom denies any concerns or questions.  Reviewed basics and encouraged STS with baby to encourage frequent feedings with cues.  Goal of 8-12 feedings per 24 hrs reviewed.  Engorgement prevention and treatment reviewed.  Mom has handout resource for OP lactation support and encouraged her to call prn for concerns.   Interventions Interventions: Breast feeding basics reviewed;Skin to skin;Breast massage;Hand express  Discharge Discharge Education: Engorgement and breast care;Warning signs for feeding baby  Consult Status Consult Status: Complete Date: 03/07/22 Follow-up type: Call as needed    [name] 03/07/2022, 11:54 AM

## 2022-03-07 NOTE — Discharge Summary (Signed)
   Postpartum Discharge Summary  Patient Name: Mary Leonard DOB: 07/18/1985 MRN: [id number]  Date of admission: 03/05/2022 Delivery date:03/05/2022  Delivering provider: GREWAL, MICHELLE  Date of discharge: 03/07/2022  Admitting diagnosis: Normal labor [O80, Z37.9] Vacuum extractor delivery, delivered [O75.9] Intrauterine pregnancy: [redacted]w[redacted]d     Secondary diagnosis:  Principal Problem:   Normal labor Active Problems:   Vacuum extractor delivery, delivered  Additional problems: None    Discharge diagnosis: Term Pregnancy Delivered                                              Post partum procedures: None Augmentation: Pitocin Complications: None  Hospital course: Onset of Labor With Vaginal Delivery      36 y.o. yo G4P3013 at [redacted]w[redacted]d was admitted in Latent Labor on 03/05/2022. Labor course was complicated byPROM, pitocin augentation, VAVD  Membrane Rupture Time/Date: 10:00 AM ,03/05/2022   Delivery Method:Vaginal, Spontaneous  Episiotomy: None  Lacerations:  None  Patient had a postpartum course complicated by none.  She is ambulating, tolerating a regular diet, passing flatus, and urinating well. Patient is discharged home in stable condition on 03/07/22.  Newborn Data: Birth date:03/05/2022  Birth time:10:18 PM  Gender:Female  Living status:Living  Apgars:7 ,8  Weight:4100 g   Magnesium Sulfate received: No BMZ received: No Rhophylac:N/A MMR:N/A T-DaP:Given prenatally Flu: N/A Transfusion:No  Physical exam  Vitals:   03/06/22 1003 03/06/22 1356 03/06/22 2100 03/07/22 0532  BP: 106/73 102/67 96/65 104/73  Pulse: 77 80 81 73  Resp: 18 16 19 18  Temp: 97.9 F (36.6 C) 97.8 F (36.6 C) 98.3 F (36.8 C) 97.8 F (36.6 C)  TempSrc: Oral Oral Oral Oral  SpO2: 99% 98% 98% 99%  Weight:      Height:       General: cooperative Lochia: appropriate Uterine Fundus: firm Incision: Healing well with no significant drainage DVT Evaluation: No evidence of DVT seen  on physical exam. Labs: Lab Results  Component Value Date   WBC 12.5 (H) 03/06/2022   HGB 12.8 03/06/2022   HCT 38.3 03/06/2022   MCV 91.6 03/06/2022   PLT 157 03/06/2022      Latest Ref Rng & Units 06/28/2011    2:50 PM  CMP  Glucose 70 - 99 mg/dL 71   BUN 6 - 23 mg/dL 11   Creatinine 0.50 - 1.10 mg/dL 0.76   Sodium 135 - 145 mEq/L 135   Potassium 3.5 - 5.3 mEq/L 4.2   Chloride 96 - 112 mEq/L 103   CO2 19 - 32 mEq/L 24   Calcium 8.4 - 10.5 mg/dL 8.8   Total Protein 6.0 - 8.3 g/dL 6.8   Total Bilirubin 0.3 - 1.2 mg/dL 0.5   Alkaline Phos 39 - 117 U/L 52   AST 0 - 37 U/L 15   ALT 0 - 35 U/L 11    Edinburgh Score:    03/06/2022    1:54 AM  Edinburgh Postnatal Depression Scale Screening Tool  I have been able to laugh and see the funny side of things. 0  I have looked forward with enjoyment to things. 0  I have blamed myself unnecessarily when things went wrong. 0  I have been anxious or worried for no good reason. 0  I have felt scared or panicky for no good reason. 0  Things have been getting   on top of me. 0  I have been so unhappy that I have had difficulty sleeping. 0  I have felt sad or miserable. 0  I have been so unhappy that I have been crying. 0  The thought of harming myself has occurred to me. 0  Edinburgh Postnatal Depression Scale Total 0      After visit meds:  Allergies as of 03/07/2022   No Known Allergies      Medication List     STOP taking these medications    cyclobenzaprine 10 MG tablet Commonly known as: FLEXERIL   methylPREDNISolone 4 MG tablet Commonly known as: Medrol       TAKE these medications    pantoprazole 20 MG tablet Commonly known as: PROTONIX Take 20 mg by mouth daily.   prenatal multivitamin Tabs tablet Take 1 tablet by mouth daily at 12 noon.         Discharge home in stable condition Infant Feeding: Bottle and Breast Infant Disposition:home with mother Discharge instruction: per After Visit Summary  and Postpartum booklet. Activity: Advance as tolerated. Pelvic rest for 6 weeks.  Diet: routine diet Anticipated Birth Control: Unsure Postpartum Appointment:6 weeks Additional Postpartum F/U:  None Future Appointments:No future appointments. Follow up Visit:      03/07/2022 Elise Jennifer Leger, MD   

## 2022-03-13 ENCOUNTER — Telehealth (HOSPITAL_COMMUNITY): Payer: Self-pay | Admitting: *Deleted

## 2022-03-13 NOTE — Telephone Encounter (Signed)
Left phone voicemail message.  Odis Hollingshead, RN 03-13-2022 at 9:15am

## 2022-03-18 ENCOUNTER — Inpatient Hospital Stay (HOSPITAL_COMMUNITY): Admission: RE | Admit: 2022-03-18 | Payer: 59 | Source: Home / Self Care | Admitting: Obstetrics and Gynecology
